# Patient Record
Sex: Male | Born: 1960
Health system: Southern US, Community
[De-identification: ages and names within clinical notes are randomized; demographics above are authoritative.]

## PROBLEM LIST (undated history)

## (undated) DIAGNOSIS — N183 Chronic kidney disease, stage 3 unspecified: Secondary | ICD-10-CM

## (undated) DIAGNOSIS — J449 Chronic obstructive pulmonary disease, unspecified: Secondary | ICD-10-CM

## (undated) DIAGNOSIS — J1569 Pneumonia due to other gram-negative bacteria: Secondary | ICD-10-CM

## (undated) DIAGNOSIS — J156 Pneumonia due to other aerobic Gram-negative bacteria: Secondary | ICD-10-CM

## (undated) DIAGNOSIS — J9621 Acute and chronic respiratory failure with hypoxia: Secondary | ICD-10-CM

---

## 2010-06-15 ENCOUNTER — Ambulatory Visit: Payer: Self-pay | Admitting: Cardiothoracic Surgery

## 2010-08-05 ENCOUNTER — Ambulatory Visit: Payer: Self-pay | Admitting: Thoracic Surgery (Cardiothoracic Vascular Surgery)

## 2010-10-19 NOTE — Assessment & Plan Note (Unsigned)
[facility] VISIT  Jackson Hahn, Jackson Hahn DOB:  [birth year]                                        August 09, 2010 CHART #:  [id number]  Dr. Jerelene Redden saw the patient in the clinic on August 05, 2010, and the patient's condition was discussed and they were in agreement that the patient will continue in followup with Dr. Atilano Median and have a CT scan in 6 months to evaluate his aneurysm.  Ishmael Holter. Jerelene Redden, MD  BC/MEDQ  D:  08/09/2010  T:  08/10/2010  Job:  [id number]

## 2011-09-19 DIAGNOSIS — Z7901 Long term (current) use of anticoagulants: Secondary | ICD-10-CM | POA: Diagnosis not present

## 2011-09-26 DIAGNOSIS — J449 Chronic obstructive pulmonary disease, unspecified: Secondary | ICD-10-CM | POA: Diagnosis not present

## 2011-10-20 DIAGNOSIS — Z7901 Long term (current) use of anticoagulants: Secondary | ICD-10-CM | POA: Diagnosis not present

## 2011-10-20 DIAGNOSIS — Z79899 Other long term (current) drug therapy: Secondary | ICD-10-CM | POA: Diagnosis not present

## 2011-10-30 DIAGNOSIS — J309 Allergic rhinitis, unspecified: Secondary | ICD-10-CM | POA: Diagnosis not present

## 2011-10-30 DIAGNOSIS — J449 Chronic obstructive pulmonary disease, unspecified: Secondary | ICD-10-CM | POA: Diagnosis not present

## 2011-10-30 DIAGNOSIS — Z954 Presence of other heart-valve replacement: Secondary | ICD-10-CM | POA: Diagnosis not present

## 2011-10-30 DIAGNOSIS — R0602 Shortness of breath: Secondary | ICD-10-CM | POA: Diagnosis not present

## 2011-11-03 DIAGNOSIS — J309 Allergic rhinitis, unspecified: Secondary | ICD-10-CM | POA: Diagnosis not present

## 2011-11-13 DIAGNOSIS — J9801 Acute bronchospasm: Secondary | ICD-10-CM | POA: Diagnosis not present

## 2011-11-13 DIAGNOSIS — J449 Chronic obstructive pulmonary disease, unspecified: Secondary | ICD-10-CM | POA: Diagnosis not present

## 2011-11-13 DIAGNOSIS — Z9109 Other allergy status, other than to drugs and biological substances: Secondary | ICD-10-CM | POA: Diagnosis not present

## 2011-11-22 DIAGNOSIS — E782 Mixed hyperlipidemia: Secondary | ICD-10-CM | POA: Diagnosis not present

## 2011-11-22 DIAGNOSIS — Z7901 Long term (current) use of anticoagulants: Secondary | ICD-10-CM | POA: Diagnosis not present

## 2011-12-14 DIAGNOSIS — J209 Acute bronchitis, unspecified: Secondary | ICD-10-CM | POA: Diagnosis not present

## 2011-12-20 DIAGNOSIS — I517 Cardiomegaly: Secondary | ICD-10-CM | POA: Diagnosis not present

## 2011-12-20 DIAGNOSIS — Z7901 Long term (current) use of anticoagulants: Secondary | ICD-10-CM | POA: Diagnosis not present

## 2011-12-20 DIAGNOSIS — E782 Mixed hyperlipidemia: Secondary | ICD-10-CM | POA: Diagnosis not present

## 2011-12-21 DIAGNOSIS — J449 Chronic obstructive pulmonary disease, unspecified: Secondary | ICD-10-CM | POA: Diagnosis not present

## 2011-12-21 DIAGNOSIS — J209 Acute bronchitis, unspecified: Secondary | ICD-10-CM | POA: Diagnosis not present

## 2011-12-21 DIAGNOSIS — I517 Cardiomegaly: Secondary | ICD-10-CM | POA: Diagnosis not present

## 2011-12-21 DIAGNOSIS — I719 Aortic aneurysm of unspecified site, without rupture: Secondary | ICD-10-CM | POA: Diagnosis not present

## 2011-12-26 DIAGNOSIS — I517 Cardiomegaly: Secondary | ICD-10-CM | POA: Diagnosis not present

## 2011-12-26 DIAGNOSIS — E782 Mixed hyperlipidemia: Secondary | ICD-10-CM | POA: Diagnosis not present

## 2011-12-26 DIAGNOSIS — J449 Chronic obstructive pulmonary disease, unspecified: Secondary | ICD-10-CM | POA: Diagnosis not present

## 2012-01-22 DIAGNOSIS — E559 Vitamin D deficiency, unspecified: Secondary | ICD-10-CM | POA: Diagnosis not present

## 2012-01-22 DIAGNOSIS — Z125 Encounter for screening for malignant neoplasm of prostate: Secondary | ICD-10-CM | POA: Diagnosis not present

## 2012-01-22 DIAGNOSIS — E782 Mixed hyperlipidemia: Secondary | ICD-10-CM | POA: Diagnosis not present

## 2012-01-22 DIAGNOSIS — I517 Cardiomegaly: Secondary | ICD-10-CM | POA: Diagnosis not present

## 2012-01-22 DIAGNOSIS — N529 Male erectile dysfunction, unspecified: Secondary | ICD-10-CM | POA: Diagnosis not present

## 2012-01-22 DIAGNOSIS — R0602 Shortness of breath: Secondary | ICD-10-CM | POA: Diagnosis not present

## 2012-01-22 DIAGNOSIS — Z7901 Long term (current) use of anticoagulants: Secondary | ICD-10-CM | POA: Diagnosis not present

## 2012-01-25 DIAGNOSIS — I719 Aortic aneurysm of unspecified site, without rupture: Secondary | ICD-10-CM | POA: Diagnosis not present

## 2012-02-01 DIAGNOSIS — J449 Chronic obstructive pulmonary disease, unspecified: Secondary | ICD-10-CM | POA: Diagnosis not present

## 2012-02-01 DIAGNOSIS — J9801 Acute bronchospasm: Secondary | ICD-10-CM | POA: Diagnosis not present

## 2012-02-06 DIAGNOSIS — N289 Disorder of kidney and ureter, unspecified: Secondary | ICD-10-CM | POA: Diagnosis not present

## 2012-02-06 DIAGNOSIS — D689 Coagulation defect, unspecified: Secondary | ICD-10-CM | POA: Diagnosis not present

## 2012-02-06 DIAGNOSIS — Z7901 Long term (current) use of anticoagulants: Secondary | ICD-10-CM | POA: Diagnosis not present

## 2012-02-06 DIAGNOSIS — E782 Mixed hyperlipidemia: Secondary | ICD-10-CM | POA: Diagnosis not present

## 2012-02-06 DIAGNOSIS — I719 Aortic aneurysm of unspecified site, without rupture: Secondary | ICD-10-CM | POA: Diagnosis not present

## 2012-02-07 DIAGNOSIS — N289 Disorder of kidney and ureter, unspecified: Secondary | ICD-10-CM | POA: Diagnosis not present

## 2012-03-05 DIAGNOSIS — E782 Mixed hyperlipidemia: Secondary | ICD-10-CM | POA: Diagnosis not present

## 2012-03-05 DIAGNOSIS — Z0000 Encounter for general adult medical examination without abnormal findings: Secondary | ICD-10-CM | POA: Diagnosis not present

## 2012-03-05 DIAGNOSIS — J069 Acute upper respiratory infection, unspecified: Secondary | ICD-10-CM | POA: Diagnosis not present

## 2012-03-05 DIAGNOSIS — Z7901 Long term (current) use of anticoagulants: Secondary | ICD-10-CM | POA: Diagnosis not present

## 2012-04-03 DIAGNOSIS — E782 Mixed hyperlipidemia: Secondary | ICD-10-CM | POA: Diagnosis not present

## 2012-04-03 DIAGNOSIS — J449 Chronic obstructive pulmonary disease, unspecified: Secondary | ICD-10-CM | POA: Diagnosis not present

## 2012-04-03 DIAGNOSIS — Z7901 Long term (current) use of anticoagulants: Secondary | ICD-10-CM | POA: Diagnosis not present

## 2012-04-03 DIAGNOSIS — D689 Coagulation defect, unspecified: Secondary | ICD-10-CM | POA: Diagnosis not present

## 2012-05-02 DIAGNOSIS — Z954 Presence of other heart-valve replacement: Secondary | ICD-10-CM | POA: Diagnosis not present

## 2012-05-02 DIAGNOSIS — I712 Thoracic aortic aneurysm, without rupture: Secondary | ICD-10-CM | POA: Diagnosis not present

## 2012-05-02 DIAGNOSIS — J449 Chronic obstructive pulmonary disease, unspecified: Secondary | ICD-10-CM | POA: Diagnosis not present

## 2012-05-02 DIAGNOSIS — Z79899 Other long term (current) drug therapy: Secondary | ICD-10-CM | POA: Diagnosis not present

## 2012-05-03 DIAGNOSIS — D689 Coagulation defect, unspecified: Secondary | ICD-10-CM | POA: Diagnosis not present

## 2012-05-03 DIAGNOSIS — Z7901 Long term (current) use of anticoagulants: Secondary | ICD-10-CM | POA: Diagnosis not present

## 2012-05-03 DIAGNOSIS — Z79899 Other long term (current) drug therapy: Secondary | ICD-10-CM | POA: Diagnosis not present

## 2012-05-06 DIAGNOSIS — K7689 Other specified diseases of liver: Secondary | ICD-10-CM | POA: Diagnosis not present

## 2012-05-06 DIAGNOSIS — I712 Thoracic aortic aneurysm, without rupture: Secondary | ICD-10-CM | POA: Diagnosis not present

## 2012-05-07 DIAGNOSIS — J449 Chronic obstructive pulmonary disease, unspecified: Secondary | ICD-10-CM | POA: Diagnosis not present

## 2012-05-07 DIAGNOSIS — I712 Thoracic aortic aneurysm, without rupture: Secondary | ICD-10-CM | POA: Diagnosis not present

## 2012-06-03 DIAGNOSIS — Z7901 Long term (current) use of anticoagulants: Secondary | ICD-10-CM | POA: Diagnosis not present

## 2012-06-03 DIAGNOSIS — E782 Mixed hyperlipidemia: Secondary | ICD-10-CM | POA: Diagnosis not present

## 2012-06-03 DIAGNOSIS — D689 Coagulation defect, unspecified: Secondary | ICD-10-CM | POA: Diagnosis not present

## 2012-07-04 DIAGNOSIS — E782 Mixed hyperlipidemia: Secondary | ICD-10-CM | POA: Diagnosis not present

## 2012-07-04 DIAGNOSIS — D689 Coagulation defect, unspecified: Secondary | ICD-10-CM | POA: Diagnosis not present

## 2012-07-04 DIAGNOSIS — Z7901 Long term (current) use of anticoagulants: Secondary | ICD-10-CM | POA: Diagnosis not present

## 2012-08-02 DIAGNOSIS — Z7901 Long term (current) use of anticoagulants: Secondary | ICD-10-CM | POA: Diagnosis not present

## 2012-08-02 DIAGNOSIS — E782 Mixed hyperlipidemia: Secondary | ICD-10-CM | POA: Diagnosis not present

## 2012-08-02 DIAGNOSIS — E559 Vitamin D deficiency, unspecified: Secondary | ICD-10-CM | POA: Diagnosis not present

## 2012-08-02 DIAGNOSIS — R0602 Shortness of breath: Secondary | ICD-10-CM | POA: Diagnosis not present

## 2012-08-02 DIAGNOSIS — D689 Coagulation defect, unspecified: Secondary | ICD-10-CM | POA: Diagnosis not present

## 2012-08-23 DIAGNOSIS — J449 Chronic obstructive pulmonary disease, unspecified: Secondary | ICD-10-CM | POA: Diagnosis not present

## 2012-08-23 DIAGNOSIS — J9801 Acute bronchospasm: Secondary | ICD-10-CM | POA: Diagnosis not present

## 2012-08-23 DIAGNOSIS — Z79899 Other long term (current) drug therapy: Secondary | ICD-10-CM | POA: Diagnosis not present

## 2012-09-05 DIAGNOSIS — E559 Vitamin D deficiency, unspecified: Secondary | ICD-10-CM | POA: Diagnosis not present

## 2012-09-05 DIAGNOSIS — E782 Mixed hyperlipidemia: Secondary | ICD-10-CM | POA: Diagnosis not present

## 2012-09-05 DIAGNOSIS — D689 Coagulation defect, unspecified: Secondary | ICD-10-CM | POA: Diagnosis not present

## 2012-09-24 DIAGNOSIS — D689 Coagulation defect, unspecified: Secondary | ICD-10-CM | POA: Diagnosis not present

## 2012-09-24 DIAGNOSIS — J069 Acute upper respiratory infection, unspecified: Secondary | ICD-10-CM | POA: Diagnosis not present

## 2012-10-10 DIAGNOSIS — Z7901 Long term (current) use of anticoagulants: Secondary | ICD-10-CM | POA: Diagnosis not present

## 2012-10-10 DIAGNOSIS — D689 Coagulation defect, unspecified: Secondary | ICD-10-CM | POA: Diagnosis not present

## 2012-10-25 DIAGNOSIS — D689 Coagulation defect, unspecified: Secondary | ICD-10-CM | POA: Diagnosis not present

## 2012-10-25 DIAGNOSIS — Z7901 Long term (current) use of anticoagulants: Secondary | ICD-10-CM | POA: Diagnosis not present

## 2012-11-22 DIAGNOSIS — E782 Mixed hyperlipidemia: Secondary | ICD-10-CM | POA: Diagnosis not present

## 2012-11-22 DIAGNOSIS — D689 Coagulation defect, unspecified: Secondary | ICD-10-CM | POA: Diagnosis not present

## 2012-11-22 DIAGNOSIS — Z7901 Long term (current) use of anticoagulants: Secondary | ICD-10-CM | POA: Diagnosis not present

## 2012-11-22 DIAGNOSIS — J069 Acute upper respiratory infection, unspecified: Secondary | ICD-10-CM | POA: Diagnosis not present

## 2012-12-17 DIAGNOSIS — I719 Aortic aneurysm of unspecified site, without rupture: Secondary | ICD-10-CM | POA: Diagnosis not present

## 2012-12-17 DIAGNOSIS — Q605 Renal hypoplasia, unspecified: Secondary | ICD-10-CM | POA: Diagnosis not present

## 2012-12-17 DIAGNOSIS — Q602 Renal agenesis, unspecified: Secondary | ICD-10-CM | POA: Diagnosis not present

## 2012-12-17 DIAGNOSIS — Z954 Presence of other heart-valve replacement: Secondary | ICD-10-CM | POA: Diagnosis not present

## 2012-12-17 DIAGNOSIS — J449 Chronic obstructive pulmonary disease, unspecified: Secondary | ICD-10-CM | POA: Diagnosis not present

## 2012-12-25 DIAGNOSIS — E782 Mixed hyperlipidemia: Secondary | ICD-10-CM | POA: Diagnosis not present

## 2012-12-25 DIAGNOSIS — Z7901 Long term (current) use of anticoagulants: Secondary | ICD-10-CM | POA: Diagnosis not present

## 2012-12-25 DIAGNOSIS — D689 Coagulation defect, unspecified: Secondary | ICD-10-CM | POA: Diagnosis not present

## 2013-01-15 DIAGNOSIS — E782 Mixed hyperlipidemia: Secondary | ICD-10-CM | POA: Diagnosis not present

## 2013-01-15 DIAGNOSIS — Z7901 Long term (current) use of anticoagulants: Secondary | ICD-10-CM | POA: Diagnosis not present

## 2013-01-15 DIAGNOSIS — D689 Coagulation defect, unspecified: Secondary | ICD-10-CM | POA: Diagnosis not present

## 2013-02-14 DIAGNOSIS — J4489 Other specified chronic obstructive pulmonary disease: Secondary | ICD-10-CM | POA: Diagnosis not present

## 2013-02-14 DIAGNOSIS — Z7901 Long term (current) use of anticoagulants: Secondary | ICD-10-CM | POA: Diagnosis not present

## 2013-02-14 DIAGNOSIS — J449 Chronic obstructive pulmonary disease, unspecified: Secondary | ICD-10-CM | POA: Diagnosis not present

## 2013-02-14 DIAGNOSIS — Z954 Presence of other heart-valve replacement: Secondary | ICD-10-CM | POA: Diagnosis not present

## 2013-03-17 DIAGNOSIS — Z6836 Body mass index (BMI) 36.0-36.9, adult: Secondary | ICD-10-CM | POA: Diagnosis not present

## 2013-03-17 DIAGNOSIS — J449 Chronic obstructive pulmonary disease, unspecified: Secondary | ICD-10-CM | POA: Diagnosis not present

## 2013-03-17 DIAGNOSIS — J4489 Other specified chronic obstructive pulmonary disease: Secondary | ICD-10-CM | POA: Diagnosis not present

## 2013-03-17 DIAGNOSIS — E782 Mixed hyperlipidemia: Secondary | ICD-10-CM | POA: Diagnosis not present

## 2013-03-17 DIAGNOSIS — Z7901 Long term (current) use of anticoagulants: Secondary | ICD-10-CM | POA: Diagnosis not present

## 2013-03-20 DIAGNOSIS — M949 Disorder of cartilage, unspecified: Secondary | ICD-10-CM | POA: Diagnosis not present

## 2013-03-20 DIAGNOSIS — M899 Disorder of bone, unspecified: Secondary | ICD-10-CM | POA: Diagnosis not present

## 2013-04-17 DIAGNOSIS — Z7901 Long term (current) use of anticoagulants: Secondary | ICD-10-CM | POA: Diagnosis not present

## 2013-04-17 DIAGNOSIS — Z79899 Other long term (current) drug therapy: Secondary | ICD-10-CM | POA: Diagnosis not present

## 2013-04-17 DIAGNOSIS — E782 Mixed hyperlipidemia: Secondary | ICD-10-CM | POA: Diagnosis not present

## 2013-04-17 DIAGNOSIS — J449 Chronic obstructive pulmonary disease, unspecified: Secondary | ICD-10-CM | POA: Diagnosis not present

## 2013-04-17 DIAGNOSIS — Z6836 Body mass index (BMI) 36.0-36.9, adult: Secondary | ICD-10-CM | POA: Diagnosis not present

## 2013-04-18 DIAGNOSIS — Z79899 Other long term (current) drug therapy: Secondary | ICD-10-CM | POA: Diagnosis not present

## 2013-04-18 DIAGNOSIS — Z954 Presence of other heart-valve replacement: Secondary | ICD-10-CM | POA: Diagnosis not present

## 2013-04-18 DIAGNOSIS — J449 Chronic obstructive pulmonary disease, unspecified: Secondary | ICD-10-CM | POA: Diagnosis not present

## 2013-04-18 DIAGNOSIS — F17200 Nicotine dependence, unspecified, uncomplicated: Secondary | ICD-10-CM | POA: Diagnosis not present

## 2013-05-07 DIAGNOSIS — J3489 Other specified disorders of nose and nasal sinuses: Secondary | ICD-10-CM | POA: Diagnosis not present

## 2013-05-07 DIAGNOSIS — R369 Urethral discharge, unspecified: Secondary | ICD-10-CM | POA: Diagnosis not present

## 2013-05-07 DIAGNOSIS — Z7901 Long term (current) use of anticoagulants: Secondary | ICD-10-CM | POA: Diagnosis not present

## 2013-05-07 DIAGNOSIS — S81009A Unspecified open wound, unspecified knee, initial encounter: Secondary | ICD-10-CM | POA: Diagnosis not present

## 2013-05-07 DIAGNOSIS — E782 Mixed hyperlipidemia: Secondary | ICD-10-CM | POA: Diagnosis not present

## 2013-05-07 DIAGNOSIS — Z79899 Other long term (current) drug therapy: Secondary | ICD-10-CM | POA: Diagnosis not present

## 2013-05-09 DIAGNOSIS — I712 Thoracic aortic aneurysm, without rupture: Secondary | ICD-10-CM | POA: Diagnosis not present

## 2013-05-09 DIAGNOSIS — Z954 Presence of other heart-valve replacement: Secondary | ICD-10-CM | POA: Diagnosis not present

## 2013-05-15 DIAGNOSIS — I712 Thoracic aortic aneurysm, without rupture: Secondary | ICD-10-CM | POA: Diagnosis not present

## 2013-05-27 DIAGNOSIS — E782 Mixed hyperlipidemia: Secondary | ICD-10-CM | POA: Diagnosis not present

## 2013-05-27 DIAGNOSIS — R369 Urethral discharge, unspecified: Secondary | ICD-10-CM | POA: Diagnosis not present

## 2013-05-27 DIAGNOSIS — Z7901 Long term (current) use of anticoagulants: Secondary | ICD-10-CM | POA: Diagnosis not present

## 2013-06-16 DIAGNOSIS — J42 Unspecified chronic bronchitis: Secondary | ICD-10-CM | POA: Diagnosis not present

## 2013-06-18 DIAGNOSIS — J449 Chronic obstructive pulmonary disease, unspecified: Secondary | ICD-10-CM | POA: Diagnosis not present

## 2013-06-18 DIAGNOSIS — J9801 Acute bronchospasm: Secondary | ICD-10-CM | POA: Diagnosis not present

## 2013-06-18 DIAGNOSIS — Z954 Presence of other heart-valve replacement: Secondary | ICD-10-CM | POA: Diagnosis not present

## 2013-06-18 DIAGNOSIS — R0902 Hypoxemia: Secondary | ICD-10-CM | POA: Diagnosis not present

## 2013-06-18 DIAGNOSIS — F17200 Nicotine dependence, unspecified, uncomplicated: Secondary | ICD-10-CM | POA: Diagnosis not present

## 2013-06-25 DIAGNOSIS — J45909 Unspecified asthma, uncomplicated: Secondary | ICD-10-CM | POA: Diagnosis not present

## 2013-06-25 DIAGNOSIS — R0902 Hypoxemia: Secondary | ICD-10-CM | POA: Diagnosis not present

## 2013-06-25 DIAGNOSIS — J209 Acute bronchitis, unspecified: Secondary | ICD-10-CM | POA: Diagnosis not present

## 2013-06-26 DIAGNOSIS — Z7901 Long term (current) use of anticoagulants: Secondary | ICD-10-CM | POA: Diagnosis not present

## 2013-06-26 DIAGNOSIS — J449 Chronic obstructive pulmonary disease, unspecified: Secondary | ICD-10-CM | POA: Diagnosis not present

## 2013-06-26 DIAGNOSIS — J4489 Other specified chronic obstructive pulmonary disease: Secondary | ICD-10-CM | POA: Diagnosis not present

## 2013-06-26 DIAGNOSIS — E782 Mixed hyperlipidemia: Secondary | ICD-10-CM | POA: Diagnosis not present

## 2013-07-28 DIAGNOSIS — E782 Mixed hyperlipidemia: Secondary | ICD-10-CM | POA: Diagnosis not present

## 2013-07-28 DIAGNOSIS — J449 Chronic obstructive pulmonary disease, unspecified: Secondary | ICD-10-CM | POA: Diagnosis not present

## 2013-07-28 DIAGNOSIS — Z7901 Long term (current) use of anticoagulants: Secondary | ICD-10-CM | POA: Diagnosis not present

## 2013-07-28 DIAGNOSIS — Z6836 Body mass index (BMI) 36.0-36.9, adult: Secondary | ICD-10-CM | POA: Diagnosis not present

## 2013-07-28 DIAGNOSIS — Z79899 Other long term (current) drug therapy: Secondary | ICD-10-CM | POA: Diagnosis not present

## 2013-08-04 DIAGNOSIS — R300 Dysuria: Secondary | ICD-10-CM | POA: Diagnosis not present

## 2013-08-04 DIAGNOSIS — Z7901 Long term (current) use of anticoagulants: Secondary | ICD-10-CM | POA: Diagnosis not present

## 2013-08-04 DIAGNOSIS — R369 Urethral discharge, unspecified: Secondary | ICD-10-CM | POA: Diagnosis not present

## 2013-08-15 DIAGNOSIS — Q602 Renal agenesis, unspecified: Secondary | ICD-10-CM | POA: Diagnosis not present

## 2013-08-15 DIAGNOSIS — J449 Chronic obstructive pulmonary disease, unspecified: Secondary | ICD-10-CM | POA: Diagnosis not present

## 2013-08-15 DIAGNOSIS — R0602 Shortness of breath: Secondary | ICD-10-CM | POA: Diagnosis not present

## 2013-08-15 DIAGNOSIS — I719 Aortic aneurysm of unspecified site, without rupture: Secondary | ICD-10-CM | POA: Diagnosis not present

## 2013-08-29 DIAGNOSIS — J449 Chronic obstructive pulmonary disease, unspecified: Secondary | ICD-10-CM | POA: Diagnosis not present

## 2013-08-29 DIAGNOSIS — E782 Mixed hyperlipidemia: Secondary | ICD-10-CM | POA: Diagnosis not present

## 2013-08-29 DIAGNOSIS — Z7901 Long term (current) use of anticoagulants: Secondary | ICD-10-CM | POA: Diagnosis not present

## 2013-09-29 DIAGNOSIS — Z6837 Body mass index (BMI) 37.0-37.9, adult: Secondary | ICD-10-CM | POA: Diagnosis not present

## 2013-09-29 DIAGNOSIS — J309 Allergic rhinitis, unspecified: Secondary | ICD-10-CM | POA: Diagnosis not present

## 2013-09-29 DIAGNOSIS — J4489 Other specified chronic obstructive pulmonary disease: Secondary | ICD-10-CM | POA: Diagnosis not present

## 2013-09-29 DIAGNOSIS — J449 Chronic obstructive pulmonary disease, unspecified: Secondary | ICD-10-CM | POA: Diagnosis not present

## 2013-09-29 DIAGNOSIS — Z7901 Long term (current) use of anticoagulants: Secondary | ICD-10-CM | POA: Diagnosis not present

## 2013-10-02 DIAGNOSIS — K5730 Diverticulosis of large intestine without perforation or abscess without bleeding: Secondary | ICD-10-CM | POA: Diagnosis not present

## 2013-10-02 DIAGNOSIS — Z954 Presence of other heart-valve replacement: Secondary | ICD-10-CM | POA: Diagnosis not present

## 2013-10-02 DIAGNOSIS — Z79899 Other long term (current) drug therapy: Secondary | ICD-10-CM | POA: Diagnosis not present

## 2013-10-02 DIAGNOSIS — K648 Other hemorrhoids: Secondary | ICD-10-CM | POA: Diagnosis not present

## 2013-10-02 DIAGNOSIS — K5900 Constipation, unspecified: Secondary | ICD-10-CM | POA: Diagnosis not present

## 2013-10-02 DIAGNOSIS — Z1211 Encounter for screening for malignant neoplasm of colon: Secondary | ICD-10-CM | POA: Diagnosis not present

## 2013-10-02 DIAGNOSIS — Z9889 Other specified postprocedural states: Secondary | ICD-10-CM | POA: Diagnosis not present

## 2013-10-02 DIAGNOSIS — D128 Benign neoplasm of rectum: Secondary | ICD-10-CM | POA: Diagnosis not present

## 2013-10-02 DIAGNOSIS — K620 Anal polyp: Secondary | ICD-10-CM | POA: Diagnosis not present

## 2013-10-02 DIAGNOSIS — Z96649 Presence of unspecified artificial hip joint: Secondary | ICD-10-CM | POA: Diagnosis not present

## 2013-10-02 DIAGNOSIS — K621 Rectal polyp: Secondary | ICD-10-CM | POA: Diagnosis not present

## 2013-10-02 DIAGNOSIS — E7800 Pure hypercholesterolemia, unspecified: Secondary | ICD-10-CM | POA: Diagnosis not present

## 2013-10-02 DIAGNOSIS — J449 Chronic obstructive pulmonary disease, unspecified: Secondary | ICD-10-CM | POA: Diagnosis not present

## 2013-10-02 DIAGNOSIS — D129 Benign neoplasm of anus and anal canal: Secondary | ICD-10-CM | POA: Diagnosis not present

## 2013-10-06 DIAGNOSIS — Z7901 Long term (current) use of anticoagulants: Secondary | ICD-10-CM | POA: Diagnosis not present

## 2013-10-15 DIAGNOSIS — J449 Chronic obstructive pulmonary disease, unspecified: Secondary | ICD-10-CM | POA: Diagnosis not present

## 2013-10-15 DIAGNOSIS — I517 Cardiomegaly: Secondary | ICD-10-CM | POA: Diagnosis not present

## 2013-10-15 DIAGNOSIS — R0602 Shortness of breath: Secondary | ICD-10-CM | POA: Diagnosis not present

## 2013-10-15 DIAGNOSIS — J9801 Acute bronchospasm: Secondary | ICD-10-CM | POA: Diagnosis not present

## 2013-10-24 DIAGNOSIS — F17200 Nicotine dependence, unspecified, uncomplicated: Secondary | ICD-10-CM | POA: Diagnosis not present

## 2013-10-24 DIAGNOSIS — J42 Unspecified chronic bronchitis: Secondary | ICD-10-CM | POA: Diagnosis not present

## 2013-10-26 DIAGNOSIS — J189 Pneumonia, unspecified organism: Secondary | ICD-10-CM | POA: Diagnosis not present

## 2013-10-26 DIAGNOSIS — N189 Chronic kidney disease, unspecified: Secondary | ICD-10-CM | POA: Diagnosis not present

## 2013-10-26 DIAGNOSIS — I428 Other cardiomyopathies: Secondary | ICD-10-CM | POA: Diagnosis not present

## 2013-10-26 DIAGNOSIS — R0902 Hypoxemia: Secondary | ICD-10-CM | POA: Diagnosis not present

## 2013-10-26 DIAGNOSIS — I959 Hypotension, unspecified: Secondary | ICD-10-CM | POA: Diagnosis present

## 2013-10-26 DIAGNOSIS — I517 Cardiomegaly: Secondary | ICD-10-CM | POA: Diagnosis not present

## 2013-10-26 DIAGNOSIS — I4901 Ventricular fibrillation: Secondary | ICD-10-CM | POA: Diagnosis not present

## 2013-10-26 DIAGNOSIS — N179 Acute kidney failure, unspecified: Secondary | ICD-10-CM | POA: Diagnosis present

## 2013-10-26 DIAGNOSIS — J4489 Other specified chronic obstructive pulmonary disease: Secondary | ICD-10-CM | POA: Diagnosis not present

## 2013-10-26 DIAGNOSIS — J449 Chronic obstructive pulmonary disease, unspecified: Secondary | ICD-10-CM | POA: Diagnosis not present

## 2013-10-26 DIAGNOSIS — R5381 Other malaise: Secondary | ICD-10-CM | POA: Diagnosis present

## 2013-10-26 DIAGNOSIS — R141 Gas pain: Secondary | ICD-10-CM | POA: Diagnosis not present

## 2013-10-26 DIAGNOSIS — I359 Nonrheumatic aortic valve disorder, unspecified: Secondary | ICD-10-CM | POA: Diagnosis not present

## 2013-10-26 DIAGNOSIS — Z8674 Personal history of sudden cardiac arrest: Secondary | ICD-10-CM | POA: Diagnosis not present

## 2013-10-26 DIAGNOSIS — R143 Flatulence: Secondary | ICD-10-CM | POA: Diagnosis not present

## 2013-10-26 DIAGNOSIS — E875 Hyperkalemia: Secondary | ICD-10-CM | POA: Diagnosis not present

## 2013-10-26 DIAGNOSIS — K6389 Other specified diseases of intestine: Secondary | ICD-10-CM | POA: Diagnosis not present

## 2013-10-26 DIAGNOSIS — J45901 Unspecified asthma with (acute) exacerbation: Secondary | ICD-10-CM | POA: Diagnosis present

## 2013-10-26 DIAGNOSIS — J9801 Acute bronchospasm: Secondary | ICD-10-CM | POA: Diagnosis not present

## 2013-10-26 DIAGNOSIS — Z9911 Dependence on respirator [ventilator] status: Secondary | ICD-10-CM | POA: Diagnosis not present

## 2013-10-26 DIAGNOSIS — I469 Cardiac arrest, cause unspecified: Secondary | ICD-10-CM | POA: Diagnosis not present

## 2013-10-26 DIAGNOSIS — R0602 Shortness of breath: Secondary | ICD-10-CM | POA: Diagnosis not present

## 2013-10-26 DIAGNOSIS — Z954 Presence of other heart-valve replacement: Secondary | ICD-10-CM | POA: Diagnosis not present

## 2013-10-26 DIAGNOSIS — J159 Unspecified bacterial pneumonia: Secondary | ICD-10-CM | POA: Diagnosis not present

## 2013-10-26 DIAGNOSIS — Z4682 Encounter for fitting and adjustment of non-vascular catheter: Secondary | ICD-10-CM | POA: Diagnosis not present

## 2013-10-26 DIAGNOSIS — I509 Heart failure, unspecified: Secondary | ICD-10-CM | POA: Diagnosis not present

## 2013-10-26 DIAGNOSIS — R918 Other nonspecific abnormal finding of lung field: Secondary | ICD-10-CM | POA: Diagnosis not present

## 2013-10-26 DIAGNOSIS — I712 Thoracic aortic aneurysm, without rupture, unspecified: Secondary | ICD-10-CM | POA: Diagnosis present

## 2013-10-26 DIAGNOSIS — I498 Other specified cardiac arrhythmias: Secondary | ICD-10-CM | POA: Diagnosis present

## 2013-10-26 DIAGNOSIS — J9600 Acute respiratory failure, unspecified whether with hypoxia or hypercapnia: Secondary | ICD-10-CM | POA: Diagnosis not present

## 2013-10-26 DIAGNOSIS — E119 Type 2 diabetes mellitus without complications: Secondary | ICD-10-CM | POA: Diagnosis present

## 2013-10-26 DIAGNOSIS — Z7901 Long term (current) use of anticoagulants: Secondary | ICD-10-CM | POA: Diagnosis not present

## 2013-10-26 DIAGNOSIS — A419 Sepsis, unspecified organism: Secondary | ICD-10-CM | POA: Diagnosis not present

## 2013-10-26 DIAGNOSIS — J441 Chronic obstructive pulmonary disease with (acute) exacerbation: Secondary | ICD-10-CM | POA: Diagnosis present

## 2013-10-26 DIAGNOSIS — R0609 Other forms of dyspnea: Secondary | ICD-10-CM | POA: Diagnosis not present

## 2013-10-26 DIAGNOSIS — R748 Abnormal levels of other serum enzymes: Secondary | ICD-10-CM | POA: Diagnosis not present

## 2013-10-26 DIAGNOSIS — J9620 Acute and chronic respiratory failure, unspecified whether with hypoxia or hypercapnia: Secondary | ICD-10-CM | POA: Diagnosis not present

## 2013-10-26 DIAGNOSIS — Z452 Encounter for adjustment and management of vascular access device: Secondary | ICD-10-CM | POA: Diagnosis not present

## 2013-11-04 DIAGNOSIS — J4489 Other specified chronic obstructive pulmonary disease: Secondary | ICD-10-CM | POA: Diagnosis not present

## 2013-11-04 DIAGNOSIS — Z7901 Long term (current) use of anticoagulants: Secondary | ICD-10-CM | POA: Diagnosis not present

## 2013-11-04 DIAGNOSIS — J449 Chronic obstructive pulmonary disease, unspecified: Secondary | ICD-10-CM | POA: Diagnosis not present

## 2013-11-04 DIAGNOSIS — E782 Mixed hyperlipidemia: Secondary | ICD-10-CM | POA: Diagnosis not present

## 2013-11-10 DIAGNOSIS — B370 Candidal stomatitis: Secondary | ICD-10-CM | POA: Diagnosis not present

## 2013-11-10 DIAGNOSIS — I517 Cardiomegaly: Secondary | ICD-10-CM | POA: Diagnosis not present

## 2013-11-10 DIAGNOSIS — J449 Chronic obstructive pulmonary disease, unspecified: Secondary | ICD-10-CM | POA: Diagnosis not present

## 2013-11-10 DIAGNOSIS — J45909 Unspecified asthma, uncomplicated: Secondary | ICD-10-CM | POA: Diagnosis not present

## 2013-11-10 DIAGNOSIS — J4489 Other specified chronic obstructive pulmonary disease: Secondary | ICD-10-CM | POA: Diagnosis not present

## 2013-11-17 DIAGNOSIS — Z79899 Other long term (current) drug therapy: Secondary | ICD-10-CM | POA: Diagnosis not present

## 2013-11-17 DIAGNOSIS — I469 Cardiac arrest, cause unspecified: Secondary | ICD-10-CM | POA: Diagnosis not present

## 2013-11-17 DIAGNOSIS — J9801 Acute bronchospasm: Secondary | ICD-10-CM | POA: Diagnosis not present

## 2013-11-17 DIAGNOSIS — J449 Chronic obstructive pulmonary disease, unspecified: Secondary | ICD-10-CM | POA: Diagnosis not present

## 2013-11-17 DIAGNOSIS — Z7901 Long term (current) use of anticoagulants: Secondary | ICD-10-CM | POA: Diagnosis not present

## 2013-11-18 DIAGNOSIS — N179 Acute kidney failure, unspecified: Secondary | ICD-10-CM | POA: Diagnosis not present

## 2013-11-18 DIAGNOSIS — I509 Heart failure, unspecified: Secondary | ICD-10-CM | POA: Diagnosis not present

## 2013-11-18 DIAGNOSIS — J449 Chronic obstructive pulmonary disease, unspecified: Secondary | ICD-10-CM | POA: Diagnosis not present

## 2013-11-21 DIAGNOSIS — J449 Chronic obstructive pulmonary disease, unspecified: Secondary | ICD-10-CM | POA: Diagnosis not present

## 2013-11-21 DIAGNOSIS — Z79899 Other long term (current) drug therapy: Secondary | ICD-10-CM | POA: Diagnosis not present

## 2013-11-21 DIAGNOSIS — Z7901 Long term (current) use of anticoagulants: Secondary | ICD-10-CM | POA: Diagnosis not present

## 2013-11-21 DIAGNOSIS — J45909 Unspecified asthma, uncomplicated: Secondary | ICD-10-CM | POA: Diagnosis not present

## 2013-11-21 DIAGNOSIS — J4489 Other specified chronic obstructive pulmonary disease: Secondary | ICD-10-CM | POA: Diagnosis not present

## 2013-11-26 DIAGNOSIS — Z954 Presence of other heart-valve replacement: Secondary | ICD-10-CM | POA: Diagnosis not present

## 2013-11-26 DIAGNOSIS — I428 Other cardiomyopathies: Secondary | ICD-10-CM | POA: Diagnosis not present

## 2013-11-26 DIAGNOSIS — Z6834 Body mass index (BMI) 34.0-34.9, adult: Secondary | ICD-10-CM | POA: Diagnosis not present

## 2013-11-26 DIAGNOSIS — I712 Thoracic aortic aneurysm, without rupture, unspecified: Secondary | ICD-10-CM | POA: Diagnosis not present

## 2013-11-26 DIAGNOSIS — R791 Abnormal coagulation profile: Secondary | ICD-10-CM | POA: Diagnosis not present

## 2013-11-26 DIAGNOSIS — J4489 Other specified chronic obstructive pulmonary disease: Secondary | ICD-10-CM | POA: Diagnosis not present

## 2013-11-26 DIAGNOSIS — I509 Heart failure, unspecified: Secondary | ICD-10-CM | POA: Diagnosis not present

## 2013-11-26 DIAGNOSIS — J449 Chronic obstructive pulmonary disease, unspecified: Secondary | ICD-10-CM | POA: Diagnosis not present

## 2013-12-09 DIAGNOSIS — I509 Heart failure, unspecified: Secondary | ICD-10-CM | POA: Diagnosis not present

## 2013-12-09 DIAGNOSIS — Z6835 Body mass index (BMI) 35.0-35.9, adult: Secondary | ICD-10-CM | POA: Diagnosis not present

## 2013-12-09 DIAGNOSIS — E669 Obesity, unspecified: Secondary | ICD-10-CM | POA: Diagnosis not present

## 2013-12-09 DIAGNOSIS — J449 Chronic obstructive pulmonary disease, unspecified: Secondary | ICD-10-CM | POA: Diagnosis not present

## 2013-12-09 DIAGNOSIS — R791 Abnormal coagulation profile: Secondary | ICD-10-CM | POA: Diagnosis not present

## 2013-12-23 DIAGNOSIS — Z79899 Other long term (current) drug therapy: Secondary | ICD-10-CM | POA: Diagnosis not present

## 2013-12-23 DIAGNOSIS — Z954 Presence of other heart-valve replacement: Secondary | ICD-10-CM | POA: Diagnosis not present

## 2013-12-23 DIAGNOSIS — J45909 Unspecified asthma, uncomplicated: Secondary | ICD-10-CM | POA: Diagnosis not present

## 2013-12-23 DIAGNOSIS — J449 Chronic obstructive pulmonary disease, unspecified: Secondary | ICD-10-CM | POA: Diagnosis not present

## 2014-01-08 DIAGNOSIS — Z7901 Long term (current) use of anticoagulants: Secondary | ICD-10-CM | POA: Diagnosis not present

## 2014-01-08 DIAGNOSIS — E782 Mixed hyperlipidemia: Secondary | ICD-10-CM | POA: Diagnosis not present

## 2014-01-08 DIAGNOSIS — Z79899 Other long term (current) drug therapy: Secondary | ICD-10-CM | POA: Diagnosis not present

## 2014-01-08 DIAGNOSIS — Z6835 Body mass index (BMI) 35.0-35.9, adult: Secondary | ICD-10-CM | POA: Diagnosis not present

## 2014-01-20 DIAGNOSIS — N179 Acute kidney failure, unspecified: Secondary | ICD-10-CM | POA: Diagnosis not present

## 2014-01-27 DIAGNOSIS — N179 Acute kidney failure, unspecified: Secondary | ICD-10-CM | POA: Diagnosis not present

## 2014-01-27 DIAGNOSIS — N183 Chronic kidney disease, stage 3 unspecified: Secondary | ICD-10-CM | POA: Diagnosis not present

## 2014-01-27 DIAGNOSIS — F17200 Nicotine dependence, unspecified, uncomplicated: Secondary | ICD-10-CM | POA: Diagnosis not present

## 2014-01-27 DIAGNOSIS — J449 Chronic obstructive pulmonary disease, unspecified: Secondary | ICD-10-CM | POA: Diagnosis not present

## 2014-01-27 DIAGNOSIS — I509 Heart failure, unspecified: Secondary | ICD-10-CM | POA: Diagnosis not present

## 2014-02-11 DIAGNOSIS — I509 Heart failure, unspecified: Secondary | ICD-10-CM | POA: Diagnosis not present

## 2014-02-11 DIAGNOSIS — E782 Mixed hyperlipidemia: Secondary | ICD-10-CM | POA: Diagnosis not present

## 2014-02-11 DIAGNOSIS — R791 Abnormal coagulation profile: Secondary | ICD-10-CM | POA: Diagnosis not present

## 2014-02-11 DIAGNOSIS — N189 Chronic kidney disease, unspecified: Secondary | ICD-10-CM | POA: Diagnosis not present

## 2014-03-13 DIAGNOSIS — J449 Chronic obstructive pulmonary disease, unspecified: Secondary | ICD-10-CM | POA: Diagnosis not present

## 2014-03-13 DIAGNOSIS — Z6835 Body mass index (BMI) 35.0-35.9, adult: Secondary | ICD-10-CM | POA: Diagnosis not present

## 2014-03-13 DIAGNOSIS — I509 Heart failure, unspecified: Secondary | ICD-10-CM | POA: Diagnosis not present

## 2014-03-13 DIAGNOSIS — Z7901 Long term (current) use of anticoagulants: Secondary | ICD-10-CM | POA: Diagnosis not present

## 2014-03-24 DIAGNOSIS — J9801 Acute bronchospasm: Secondary | ICD-10-CM | POA: Diagnosis not present

## 2014-03-24 DIAGNOSIS — J449 Chronic obstructive pulmonary disease, unspecified: Secondary | ICD-10-CM | POA: Diagnosis not present

## 2014-03-24 DIAGNOSIS — Z954 Presence of other heart-valve replacement: Secondary | ICD-10-CM | POA: Diagnosis not present

## 2014-04-17 DIAGNOSIS — E782 Mixed hyperlipidemia: Secondary | ICD-10-CM | POA: Diagnosis not present

## 2014-04-17 DIAGNOSIS — Z7901 Long term (current) use of anticoagulants: Secondary | ICD-10-CM | POA: Diagnosis not present

## 2014-04-17 DIAGNOSIS — Z79899 Other long term (current) drug therapy: Secondary | ICD-10-CM | POA: Diagnosis not present

## 2014-04-17 DIAGNOSIS — J449 Chronic obstructive pulmonary disease, unspecified: Secondary | ICD-10-CM | POA: Diagnosis not present

## 2014-05-01 DIAGNOSIS — N183 Chronic kidney disease, stage 3 unspecified: Secondary | ICD-10-CM | POA: Diagnosis not present

## 2014-05-04 DIAGNOSIS — F17200 Nicotine dependence, unspecified, uncomplicated: Secondary | ICD-10-CM | POA: Diagnosis not present

## 2014-05-04 DIAGNOSIS — N183 Chronic kidney disease, stage 3 unspecified: Secondary | ICD-10-CM | POA: Diagnosis not present

## 2014-05-04 DIAGNOSIS — I509 Heart failure, unspecified: Secondary | ICD-10-CM | POA: Diagnosis not present

## 2014-05-19 DIAGNOSIS — N189 Chronic kidney disease, unspecified: Secondary | ICD-10-CM | POA: Diagnosis not present

## 2014-05-19 DIAGNOSIS — J449 Chronic obstructive pulmonary disease, unspecified: Secondary | ICD-10-CM | POA: Diagnosis not present

## 2014-05-19 DIAGNOSIS — E782 Mixed hyperlipidemia: Secondary | ICD-10-CM | POA: Diagnosis not present

## 2014-05-19 DIAGNOSIS — Z7901 Long term (current) use of anticoagulants: Secondary | ICD-10-CM | POA: Diagnosis not present

## 2014-06-13 DIAGNOSIS — Z720 Tobacco use: Secondary | ICD-10-CM | POA: Diagnosis not present

## 2014-06-13 DIAGNOSIS — J9622 Acute and chronic respiratory failure with hypercapnia: Secondary | ICD-10-CM | POA: Diagnosis not present

## 2014-06-13 DIAGNOSIS — J9801 Acute bronchospasm: Secondary | ICD-10-CM | POA: Diagnosis not present

## 2014-06-13 DIAGNOSIS — Z87891 Personal history of nicotine dependence: Secondary | ICD-10-CM | POA: Diagnosis not present

## 2014-06-13 DIAGNOSIS — J441 Chronic obstructive pulmonary disease with (acute) exacerbation: Secondary | ICD-10-CM | POA: Diagnosis not present

## 2014-06-13 DIAGNOSIS — J9601 Acute respiratory failure with hypoxia: Secondary | ICD-10-CM | POA: Diagnosis not present

## 2014-06-13 DIAGNOSIS — J9811 Atelectasis: Secondary | ICD-10-CM | POA: Diagnosis not present

## 2014-06-13 DIAGNOSIS — I5022 Chronic systolic (congestive) heart failure: Secondary | ICD-10-CM | POA: Diagnosis not present

## 2014-06-13 DIAGNOSIS — J9621 Acute and chronic respiratory failure with hypoxia: Secondary | ICD-10-CM | POA: Diagnosis not present

## 2014-06-13 DIAGNOSIS — R0600 Dyspnea, unspecified: Secondary | ICD-10-CM | POA: Diagnosis not present

## 2014-06-13 DIAGNOSIS — F17210 Nicotine dependence, cigarettes, uncomplicated: Secondary | ICD-10-CM | POA: Diagnosis not present

## 2014-06-13 DIAGNOSIS — J9602 Acute respiratory failure with hypercapnia: Secondary | ICD-10-CM | POA: Diagnosis not present

## 2014-06-13 DIAGNOSIS — R0602 Shortness of breath: Secondary | ICD-10-CM | POA: Diagnosis not present

## 2014-06-13 DIAGNOSIS — J9600 Acute respiratory failure, unspecified whether with hypoxia or hypercapnia: Secondary | ICD-10-CM | POA: Diagnosis not present

## 2014-06-13 DIAGNOSIS — N183 Chronic kidney disease, stage 3 (moderate): Secondary | ICD-10-CM | POA: Diagnosis not present

## 2014-06-18 DIAGNOSIS — Z7901 Long term (current) use of anticoagulants: Secondary | ICD-10-CM | POA: Diagnosis not present

## 2014-06-18 DIAGNOSIS — Z6836 Body mass index (BMI) 36.0-36.9, adult: Secondary | ICD-10-CM | POA: Diagnosis not present

## 2014-06-18 DIAGNOSIS — J189 Pneumonia, unspecified organism: Secondary | ICD-10-CM | POA: Diagnosis not present

## 2014-06-18 DIAGNOSIS — J449 Chronic obstructive pulmonary disease, unspecified: Secondary | ICD-10-CM | POA: Diagnosis not present

## 2014-07-16 DIAGNOSIS — R369 Urethral discharge, unspecified: Secondary | ICD-10-CM | POA: Diagnosis not present

## 2014-07-16 DIAGNOSIS — J449 Chronic obstructive pulmonary disease, unspecified: Secondary | ICD-10-CM | POA: Diagnosis not present

## 2014-07-16 DIAGNOSIS — I5042 Chronic combined systolic (congestive) and diastolic (congestive) heart failure: Secondary | ICD-10-CM | POA: Diagnosis not present

## 2014-07-16 DIAGNOSIS — Z2821 Immunization not carried out because of patient refusal: Secondary | ICD-10-CM | POA: Diagnosis not present

## 2014-07-16 DIAGNOSIS — Z6837 Body mass index (BMI) 37.0-37.9, adult: Secondary | ICD-10-CM | POA: Diagnosis not present

## 2014-07-16 DIAGNOSIS — Z7901 Long term (current) use of anticoagulants: Secondary | ICD-10-CM | POA: Diagnosis not present

## 2014-07-23 DIAGNOSIS — J449 Chronic obstructive pulmonary disease, unspecified: Secondary | ICD-10-CM | POA: Diagnosis not present

## 2014-07-23 DIAGNOSIS — J45909 Unspecified asthma, uncomplicated: Secondary | ICD-10-CM | POA: Diagnosis not present

## 2014-08-14 DIAGNOSIS — J449 Chronic obstructive pulmonary disease, unspecified: Secondary | ICD-10-CM | POA: Diagnosis not present

## 2014-08-14 DIAGNOSIS — E782 Mixed hyperlipidemia: Secondary | ICD-10-CM | POA: Diagnosis not present

## 2014-08-14 DIAGNOSIS — I5042 Chronic combined systolic (congestive) and diastolic (congestive) heart failure: Secondary | ICD-10-CM | POA: Diagnosis not present

## 2014-08-14 DIAGNOSIS — Z7901 Long term (current) use of anticoagulants: Secondary | ICD-10-CM | POA: Diagnosis not present

## 2014-09-03 DIAGNOSIS — N183 Chronic kidney disease, stage 3 (moderate): Secondary | ICD-10-CM | POA: Diagnosis not present

## 2014-09-07 DIAGNOSIS — N183 Chronic kidney disease, stage 3 (moderate): Secondary | ICD-10-CM | POA: Diagnosis not present

## 2014-09-07 DIAGNOSIS — F17200 Nicotine dependence, unspecified, uncomplicated: Secondary | ICD-10-CM | POA: Diagnosis not present

## 2014-09-14 DIAGNOSIS — Z6836 Body mass index (BMI) 36.0-36.9, adult: Secondary | ICD-10-CM | POA: Diagnosis not present

## 2014-09-14 DIAGNOSIS — J449 Chronic obstructive pulmonary disease, unspecified: Secondary | ICD-10-CM | POA: Diagnosis not present

## 2014-09-14 DIAGNOSIS — Z7901 Long term (current) use of anticoagulants: Secondary | ICD-10-CM | POA: Diagnosis not present

## 2014-09-14 DIAGNOSIS — J069 Acute upper respiratory infection, unspecified: Secondary | ICD-10-CM | POA: Diagnosis not present

## 2014-10-15 DIAGNOSIS — Z79899 Other long term (current) drug therapy: Secondary | ICD-10-CM | POA: Diagnosis not present

## 2014-10-15 DIAGNOSIS — Z7901 Long term (current) use of anticoagulants: Secondary | ICD-10-CM | POA: Diagnosis not present

## 2014-10-15 DIAGNOSIS — E782 Mixed hyperlipidemia: Secondary | ICD-10-CM | POA: Diagnosis not present

## 2014-10-15 DIAGNOSIS — J449 Chronic obstructive pulmonary disease, unspecified: Secondary | ICD-10-CM | POA: Diagnosis not present

## 2014-10-15 DIAGNOSIS — Z6836 Body mass index (BMI) 36.0-36.9, adult: Secondary | ICD-10-CM | POA: Diagnosis not present

## 2014-10-30 DIAGNOSIS — J45909 Unspecified asthma, uncomplicated: Secondary | ICD-10-CM | POA: Diagnosis not present

## 2014-10-30 DIAGNOSIS — R0602 Shortness of breath: Secondary | ICD-10-CM | POA: Diagnosis not present

## 2014-10-30 DIAGNOSIS — J449 Chronic obstructive pulmonary disease, unspecified: Secondary | ICD-10-CM | POA: Diagnosis not present

## 2014-11-18 DIAGNOSIS — E782 Mixed hyperlipidemia: Secondary | ICD-10-CM | POA: Diagnosis not present

## 2014-11-18 DIAGNOSIS — Z6837 Body mass index (BMI) 37.0-37.9, adult: Secondary | ICD-10-CM | POA: Diagnosis not present

## 2014-11-18 DIAGNOSIS — J449 Chronic obstructive pulmonary disease, unspecified: Secondary | ICD-10-CM | POA: Diagnosis not present

## 2014-11-18 DIAGNOSIS — Z7901 Long term (current) use of anticoagulants: Secondary | ICD-10-CM | POA: Diagnosis not present

## 2014-12-23 DIAGNOSIS — Z720 Tobacco use: Secondary | ICD-10-CM | POA: Diagnosis not present

## 2014-12-23 DIAGNOSIS — I5042 Chronic combined systolic (congestive) and diastolic (congestive) heart failure: Secondary | ICD-10-CM | POA: Diagnosis not present

## 2014-12-23 DIAGNOSIS — Z7901 Long term (current) use of anticoagulants: Secondary | ICD-10-CM | POA: Diagnosis not present

## 2014-12-23 DIAGNOSIS — F17200 Nicotine dependence, unspecified, uncomplicated: Secondary | ICD-10-CM | POA: Diagnosis not present

## 2014-12-23 DIAGNOSIS — Z6836 Body mass index (BMI) 36.0-36.9, adult: Secondary | ICD-10-CM | POA: Diagnosis not present

## 2014-12-23 DIAGNOSIS — Z5320 Procedure and treatment not carried out because of patient's decision for unspecified reasons: Secondary | ICD-10-CM | POA: Diagnosis not present

## 2014-12-23 DIAGNOSIS — J449 Chronic obstructive pulmonary disease, unspecified: Secondary | ICD-10-CM | POA: Diagnosis not present

## 2015-01-09 DIAGNOSIS — N183 Chronic kidney disease, stage 3 (moderate): Secondary | ICD-10-CM | POA: Diagnosis not present

## 2015-01-12 DIAGNOSIS — N183 Chronic kidney disease, stage 3 (moderate): Secondary | ICD-10-CM | POA: Diagnosis not present

## 2015-01-12 DIAGNOSIS — F17200 Nicotine dependence, unspecified, uncomplicated: Secondary | ICD-10-CM | POA: Diagnosis not present

## 2015-01-12 DIAGNOSIS — N179 Acute kidney failure, unspecified: Secondary | ICD-10-CM | POA: Diagnosis not present

## 2015-01-27 DIAGNOSIS — Z1389 Encounter for screening for other disorder: Secondary | ICD-10-CM | POA: Diagnosis not present

## 2015-01-27 DIAGNOSIS — Z6835 Body mass index (BMI) 35.0-35.9, adult: Secondary | ICD-10-CM | POA: Diagnosis not present

## 2015-01-27 DIAGNOSIS — E782 Mixed hyperlipidemia: Secondary | ICD-10-CM | POA: Diagnosis not present

## 2015-01-27 DIAGNOSIS — Z7901 Long term (current) use of anticoagulants: Secondary | ICD-10-CM | POA: Diagnosis not present

## 2015-01-27 DIAGNOSIS — J449 Chronic obstructive pulmonary disease, unspecified: Secondary | ICD-10-CM | POA: Diagnosis not present

## 2015-02-12 DIAGNOSIS — J449 Chronic obstructive pulmonary disease, unspecified: Secondary | ICD-10-CM | POA: Diagnosis not present

## 2015-02-12 DIAGNOSIS — R0602 Shortness of breath: Secondary | ICD-10-CM | POA: Diagnosis not present

## 2015-02-24 DIAGNOSIS — J069 Acute upper respiratory infection, unspecified: Secondary | ICD-10-CM | POA: Diagnosis not present

## 2015-02-24 DIAGNOSIS — Z7901 Long term (current) use of anticoagulants: Secondary | ICD-10-CM | POA: Diagnosis not present

## 2015-02-24 DIAGNOSIS — Z6835 Body mass index (BMI) 35.0-35.9, adult: Secondary | ICD-10-CM | POA: Diagnosis not present

## 2015-02-24 DIAGNOSIS — J449 Chronic obstructive pulmonary disease, unspecified: Secondary | ICD-10-CM | POA: Diagnosis not present

## 2015-03-30 DIAGNOSIS — J449 Chronic obstructive pulmonary disease, unspecified: Secondary | ICD-10-CM | POA: Diagnosis not present

## 2015-03-30 DIAGNOSIS — Z7901 Long term (current) use of anticoagulants: Secondary | ICD-10-CM | POA: Diagnosis not present

## 2015-03-30 DIAGNOSIS — E782 Mixed hyperlipidemia: Secondary | ICD-10-CM | POA: Diagnosis not present

## 2015-03-30 DIAGNOSIS — Z6836 Body mass index (BMI) 36.0-36.9, adult: Secondary | ICD-10-CM | POA: Diagnosis not present

## 2015-04-16 DIAGNOSIS — R791 Abnormal coagulation profile: Secondary | ICD-10-CM | POA: Diagnosis not present

## 2015-04-16 DIAGNOSIS — J449 Chronic obstructive pulmonary disease, unspecified: Secondary | ICD-10-CM | POA: Diagnosis not present

## 2015-04-16 DIAGNOSIS — Z6836 Body mass index (BMI) 36.0-36.9, adult: Secondary | ICD-10-CM | POA: Diagnosis not present

## 2015-04-16 DIAGNOSIS — E782 Mixed hyperlipidemia: Secondary | ICD-10-CM | POA: Diagnosis not present

## 2015-04-16 DIAGNOSIS — Z79899 Other long term (current) drug therapy: Secondary | ICD-10-CM | POA: Diagnosis not present

## 2015-05-17 DIAGNOSIS — N183 Chronic kidney disease, stage 3 (moderate): Secondary | ICD-10-CM | POA: Diagnosis not present

## 2015-05-19 DIAGNOSIS — Z7901 Long term (current) use of anticoagulants: Secondary | ICD-10-CM | POA: Diagnosis not present

## 2015-05-19 DIAGNOSIS — Z6836 Body mass index (BMI) 36.0-36.9, adult: Secondary | ICD-10-CM | POA: Diagnosis not present

## 2015-05-19 DIAGNOSIS — N183 Chronic kidney disease, stage 3 (moderate): Secondary | ICD-10-CM | POA: Diagnosis not present

## 2015-05-19 DIAGNOSIS — I5042 Chronic combined systolic (congestive) and diastolic (congestive) heart failure: Secondary | ICD-10-CM | POA: Diagnosis not present

## 2015-05-19 DIAGNOSIS — Z79899 Other long term (current) drug therapy: Secondary | ICD-10-CM | POA: Diagnosis not present

## 2015-05-19 DIAGNOSIS — J449 Chronic obstructive pulmonary disease, unspecified: Secondary | ICD-10-CM | POA: Diagnosis not present

## 2015-05-19 DIAGNOSIS — E782 Mixed hyperlipidemia: Secondary | ICD-10-CM | POA: Diagnosis not present

## 2015-05-21 DIAGNOSIS — Z7901 Long term (current) use of anticoagulants: Secondary | ICD-10-CM | POA: Diagnosis not present

## 2015-05-24 DIAGNOSIS — Z7901 Long term (current) use of anticoagulants: Secondary | ICD-10-CM | POA: Diagnosis not present

## 2015-05-27 DIAGNOSIS — Z952 Presence of prosthetic heart valve: Secondary | ICD-10-CM | POA: Diagnosis not present

## 2015-05-27 DIAGNOSIS — R0600 Dyspnea, unspecified: Secondary | ICD-10-CM | POA: Diagnosis not present

## 2015-05-27 DIAGNOSIS — Z9981 Dependence on supplemental oxygen: Secondary | ICD-10-CM | POA: Diagnosis not present

## 2015-05-27 DIAGNOSIS — I4891 Unspecified atrial fibrillation: Secondary | ICD-10-CM | POA: Diagnosis present

## 2015-05-27 DIAGNOSIS — Z79899 Other long term (current) drug therapy: Secondary | ICD-10-CM | POA: Diagnosis not present

## 2015-05-27 DIAGNOSIS — I5022 Chronic systolic (congestive) heart failure: Secondary | ICD-10-CM | POA: Diagnosis not present

## 2015-05-27 DIAGNOSIS — J9621 Acute and chronic respiratory failure with hypoxia: Secondary | ICD-10-CM | POA: Diagnosis not present

## 2015-05-27 DIAGNOSIS — J9602 Acute respiratory failure with hypercapnia: Secondary | ICD-10-CM | POA: Diagnosis not present

## 2015-05-27 DIAGNOSIS — E872 Acidosis: Secondary | ICD-10-CM | POA: Diagnosis not present

## 2015-05-27 DIAGNOSIS — J45909 Unspecified asthma, uncomplicated: Secondary | ICD-10-CM | POA: Diagnosis present

## 2015-05-27 DIAGNOSIS — G4733 Obstructive sleep apnea (adult) (pediatric): Secondary | ICD-10-CM | POA: Diagnosis present

## 2015-05-27 DIAGNOSIS — Z7901 Long term (current) use of anticoagulants: Secondary | ICD-10-CM | POA: Diagnosis not present

## 2015-05-27 DIAGNOSIS — F17210 Nicotine dependence, cigarettes, uncomplicated: Secondary | ICD-10-CM | POA: Diagnosis present

## 2015-05-27 DIAGNOSIS — J189 Pneumonia, unspecified organism: Secondary | ICD-10-CM | POA: Diagnosis not present

## 2015-05-27 DIAGNOSIS — J9622 Acute and chronic respiratory failure with hypercapnia: Secondary | ICD-10-CM | POA: Diagnosis present

## 2015-05-27 DIAGNOSIS — N183 Chronic kidney disease, stage 3 (moderate): Secondary | ICD-10-CM | POA: Diagnosis present

## 2015-05-27 DIAGNOSIS — R0602 Shortness of breath: Secondary | ICD-10-CM | POA: Diagnosis not present

## 2015-05-27 DIAGNOSIS — F17200 Nicotine dependence, unspecified, uncomplicated: Secondary | ICD-10-CM | POA: Diagnosis not present

## 2015-05-27 DIAGNOSIS — J441 Chronic obstructive pulmonary disease with (acute) exacerbation: Secondary | ICD-10-CM | POA: Diagnosis not present

## 2015-05-27 DIAGNOSIS — J9600 Acute respiratory failure, unspecified whether with hypoxia or hypercapnia: Secondary | ICD-10-CM | POA: Diagnosis not present

## 2015-06-01 DIAGNOSIS — J449 Chronic obstructive pulmonary disease, unspecified: Secondary | ICD-10-CM | POA: Diagnosis not present

## 2015-06-01 DIAGNOSIS — J209 Acute bronchitis, unspecified: Secondary | ICD-10-CM | POA: Diagnosis not present

## 2015-06-01 DIAGNOSIS — Z6835 Body mass index (BMI) 35.0-35.9, adult: Secondary | ICD-10-CM | POA: Diagnosis not present

## 2015-06-02 DIAGNOSIS — N179 Acute kidney failure, unspecified: Secondary | ICD-10-CM | POA: Diagnosis not present

## 2015-06-02 DIAGNOSIS — J441 Chronic obstructive pulmonary disease with (acute) exacerbation: Secondary | ICD-10-CM | POA: Diagnosis not present

## 2015-06-02 DIAGNOSIS — N183 Chronic kidney disease, stage 3 (moderate): Secondary | ICD-10-CM | POA: Diagnosis not present

## 2015-06-07 DIAGNOSIS — J449 Chronic obstructive pulmonary disease, unspecified: Secondary | ICD-10-CM | POA: Diagnosis not present

## 2015-06-07 DIAGNOSIS — Z6834 Body mass index (BMI) 34.0-34.9, adult: Secondary | ICD-10-CM | POA: Diagnosis not present

## 2015-06-07 DIAGNOSIS — R791 Abnormal coagulation profile: Secondary | ICD-10-CM | POA: Diagnosis not present

## 2015-06-07 DIAGNOSIS — Z209 Contact with and (suspected) exposure to unspecified communicable disease: Secondary | ICD-10-CM | POA: Diagnosis not present

## 2015-06-16 DIAGNOSIS — J449 Chronic obstructive pulmonary disease, unspecified: Secondary | ICD-10-CM | POA: Diagnosis not present

## 2015-06-16 DIAGNOSIS — Z79899 Other long term (current) drug therapy: Secondary | ICD-10-CM | POA: Diagnosis not present

## 2015-06-16 DIAGNOSIS — J9801 Acute bronchospasm: Secondary | ICD-10-CM | POA: Diagnosis not present

## 2015-06-16 DIAGNOSIS — Z13820 Encounter for screening for osteoporosis: Secondary | ICD-10-CM | POA: Diagnosis not present

## 2015-07-08 DIAGNOSIS — E663 Overweight: Secondary | ICD-10-CM | POA: Diagnosis not present

## 2015-07-08 DIAGNOSIS — J449 Chronic obstructive pulmonary disease, unspecified: Secondary | ICD-10-CM | POA: Diagnosis not present

## 2015-07-08 DIAGNOSIS — Z6836 Body mass index (BMI) 36.0-36.9, adult: Secondary | ICD-10-CM | POA: Diagnosis not present

## 2015-07-08 DIAGNOSIS — E782 Mixed hyperlipidemia: Secondary | ICD-10-CM | POA: Diagnosis not present

## 2015-07-08 DIAGNOSIS — Z7901 Long term (current) use of anticoagulants: Secondary | ICD-10-CM | POA: Diagnosis not present

## 2015-08-04 DIAGNOSIS — Z79899 Other long term (current) drug therapy: Secondary | ICD-10-CM | POA: Diagnosis not present

## 2015-08-04 DIAGNOSIS — E782 Mixed hyperlipidemia: Secondary | ICD-10-CM | POA: Diagnosis not present

## 2015-08-04 DIAGNOSIS — Z1389 Encounter for screening for other disorder: Secondary | ICD-10-CM | POA: Diagnosis not present

## 2015-08-04 DIAGNOSIS — Z125 Encounter for screening for malignant neoplasm of prostate: Secondary | ICD-10-CM | POA: Diagnosis not present

## 2015-08-04 DIAGNOSIS — Z7901 Long term (current) use of anticoagulants: Secondary | ICD-10-CM | POA: Diagnosis not present

## 2015-08-04 DIAGNOSIS — J449 Chronic obstructive pulmonary disease, unspecified: Secondary | ICD-10-CM | POA: Diagnosis not present

## 2015-08-04 DIAGNOSIS — Z6835 Body mass index (BMI) 35.0-35.9, adult: Secondary | ICD-10-CM | POA: Diagnosis not present

## 2015-08-04 DIAGNOSIS — J069 Acute upper respiratory infection, unspecified: Secondary | ICD-10-CM | POA: Diagnosis not present

## 2015-08-23 DIAGNOSIS — J449 Chronic obstructive pulmonary disease, unspecified: Secondary | ICD-10-CM | POA: Diagnosis not present

## 2015-08-23 DIAGNOSIS — Z6836 Body mass index (BMI) 36.0-36.9, adult: Secondary | ICD-10-CM | POA: Diagnosis not present

## 2015-08-23 DIAGNOSIS — J069 Acute upper respiratory infection, unspecified: Secondary | ICD-10-CM | POA: Diagnosis not present

## 2015-09-01 DIAGNOSIS — Z6835 Body mass index (BMI) 35.0-35.9, adult: Secondary | ICD-10-CM | POA: Diagnosis not present

## 2015-09-01 DIAGNOSIS — E782 Mixed hyperlipidemia: Secondary | ICD-10-CM | POA: Diagnosis not present

## 2015-09-01 DIAGNOSIS — J449 Chronic obstructive pulmonary disease, unspecified: Secondary | ICD-10-CM | POA: Diagnosis not present

## 2015-09-01 DIAGNOSIS — Z1389 Encounter for screening for other disorder: Secondary | ICD-10-CM | POA: Diagnosis not present

## 2015-09-01 DIAGNOSIS — Z7901 Long term (current) use of anticoagulants: Secondary | ICD-10-CM | POA: Diagnosis not present

## 2015-09-16 DIAGNOSIS — R0602 Shortness of breath: Secondary | ICD-10-CM | POA: Diagnosis not present

## 2015-09-16 DIAGNOSIS — J449 Chronic obstructive pulmonary disease, unspecified: Secondary | ICD-10-CM | POA: Diagnosis not present

## 2015-10-07 DIAGNOSIS — N183 Chronic kidney disease, stage 3 (moderate): Secondary | ICD-10-CM | POA: Diagnosis not present

## 2015-10-08 DIAGNOSIS — I509 Heart failure, unspecified: Secondary | ICD-10-CM | POA: Diagnosis not present

## 2015-10-08 DIAGNOSIS — Z7901 Long term (current) use of anticoagulants: Secondary | ICD-10-CM | POA: Diagnosis not present

## 2015-10-08 DIAGNOSIS — Z952 Presence of prosthetic heart valve: Secondary | ICD-10-CM | POA: Diagnosis not present

## 2015-10-08 DIAGNOSIS — F17210 Nicotine dependence, cigarettes, uncomplicated: Secondary | ICD-10-CM | POA: Diagnosis not present

## 2015-10-08 DIAGNOSIS — N183 Chronic kidney disease, stage 3 (moderate): Secondary | ICD-10-CM | POA: Diagnosis not present

## 2015-10-19 DIAGNOSIS — E782 Mixed hyperlipidemia: Secondary | ICD-10-CM | POA: Diagnosis not present

## 2015-10-19 DIAGNOSIS — Z6836 Body mass index (BMI) 36.0-36.9, adult: Secondary | ICD-10-CM | POA: Diagnosis not present

## 2015-10-19 DIAGNOSIS — Z7901 Long term (current) use of anticoagulants: Secondary | ICD-10-CM | POA: Diagnosis not present

## 2015-10-21 DIAGNOSIS — J449 Chronic obstructive pulmonary disease, unspecified: Secondary | ICD-10-CM | POA: Diagnosis not present

## 2015-10-21 DIAGNOSIS — J45909 Unspecified asthma, uncomplicated: Secondary | ICD-10-CM | POA: Diagnosis not present

## 2015-11-23 DIAGNOSIS — E782 Mixed hyperlipidemia: Secondary | ICD-10-CM | POA: Diagnosis not present

## 2015-11-23 DIAGNOSIS — J069 Acute upper respiratory infection, unspecified: Secondary | ICD-10-CM | POA: Diagnosis not present

## 2015-11-23 DIAGNOSIS — Z79899 Other long term (current) drug therapy: Secondary | ICD-10-CM | POA: Diagnosis not present

## 2015-11-23 DIAGNOSIS — Z6837 Body mass index (BMI) 37.0-37.9, adult: Secondary | ICD-10-CM | POA: Diagnosis not present

## 2015-11-23 DIAGNOSIS — Z7901 Long term (current) use of anticoagulants: Secondary | ICD-10-CM | POA: Diagnosis not present

## 2015-11-23 DIAGNOSIS — E559 Vitamin D deficiency, unspecified: Secondary | ICD-10-CM | POA: Diagnosis not present

## 2015-11-23 DIAGNOSIS — J449 Chronic obstructive pulmonary disease, unspecified: Secondary | ICD-10-CM | POA: Diagnosis not present

## 2015-12-22 DIAGNOSIS — Z6837 Body mass index (BMI) 37.0-37.9, adult: Secondary | ICD-10-CM | POA: Diagnosis not present

## 2015-12-22 DIAGNOSIS — Z7901 Long term (current) use of anticoagulants: Secondary | ICD-10-CM | POA: Diagnosis not present

## 2015-12-22 DIAGNOSIS — L0390 Cellulitis, unspecified: Secondary | ICD-10-CM | POA: Diagnosis not present

## 2015-12-22 DIAGNOSIS — N183 Chronic kidney disease, stage 3 (moderate): Secondary | ICD-10-CM | POA: Diagnosis not present

## 2015-12-22 DIAGNOSIS — E782 Mixed hyperlipidemia: Secondary | ICD-10-CM | POA: Diagnosis not present

## 2015-12-22 DIAGNOSIS — M722 Plantar fascial fibromatosis: Secondary | ICD-10-CM | POA: Diagnosis not present

## 2016-01-20 DIAGNOSIS — R0602 Shortness of breath: Secondary | ICD-10-CM | POA: Diagnosis not present

## 2016-01-20 DIAGNOSIS — J209 Acute bronchitis, unspecified: Secondary | ICD-10-CM | POA: Diagnosis not present

## 2016-01-24 DIAGNOSIS — Z7901 Long term (current) use of anticoagulants: Secondary | ICD-10-CM | POA: Diagnosis not present

## 2016-01-24 DIAGNOSIS — E663 Overweight: Secondary | ICD-10-CM | POA: Diagnosis not present

## 2016-01-24 DIAGNOSIS — Z6836 Body mass index (BMI) 36.0-36.9, adult: Secondary | ICD-10-CM | POA: Diagnosis not present

## 2016-01-24 DIAGNOSIS — J449 Chronic obstructive pulmonary disease, unspecified: Secondary | ICD-10-CM | POA: Diagnosis not present

## 2016-02-01 DIAGNOSIS — J449 Chronic obstructive pulmonary disease, unspecified: Secondary | ICD-10-CM | POA: Diagnosis not present

## 2016-02-01 DIAGNOSIS — J45909 Unspecified asthma, uncomplicated: Secondary | ICD-10-CM | POA: Diagnosis not present

## 2016-02-02 DIAGNOSIS — Z7901 Long term (current) use of anticoagulants: Secondary | ICD-10-CM | POA: Diagnosis not present

## 2016-02-09 DIAGNOSIS — N183 Chronic kidney disease, stage 3 (moderate): Secondary | ICD-10-CM | POA: Diagnosis not present

## 2016-02-10 DIAGNOSIS — Z720 Tobacco use: Secondary | ICD-10-CM | POA: Diagnosis not present

## 2016-02-10 DIAGNOSIS — N183 Chronic kidney disease, stage 3 (moderate): Secondary | ICD-10-CM | POA: Diagnosis not present

## 2016-02-23 DIAGNOSIS — J449 Chronic obstructive pulmonary disease, unspecified: Secondary | ICD-10-CM | POA: Diagnosis not present

## 2016-02-23 DIAGNOSIS — Z6836 Body mass index (BMI) 36.0-36.9, adult: Secondary | ICD-10-CM | POA: Diagnosis not present

## 2016-02-23 DIAGNOSIS — F17200 Nicotine dependence, unspecified, uncomplicated: Secondary | ICD-10-CM | POA: Diagnosis not present

## 2016-02-23 DIAGNOSIS — Z7901 Long term (current) use of anticoagulants: Secondary | ICD-10-CM | POA: Diagnosis not present

## 2016-02-23 DIAGNOSIS — Z720 Tobacco use: Secondary | ICD-10-CM | POA: Diagnosis not present

## 2016-02-28 DIAGNOSIS — R369 Urethral discharge, unspecified: Secondary | ICD-10-CM | POA: Diagnosis not present

## 2016-02-28 DIAGNOSIS — Z6836 Body mass index (BMI) 36.0-36.9, adult: Secondary | ICD-10-CM | POA: Diagnosis not present

## 2016-02-28 DIAGNOSIS — Z7721 Contact with and (suspected) exposure to potentially hazardous body fluids: Secondary | ICD-10-CM | POA: Diagnosis not present

## 2016-03-27 DIAGNOSIS — Z79899 Other long term (current) drug therapy: Secondary | ICD-10-CM | POA: Diagnosis not present

## 2016-03-27 DIAGNOSIS — J449 Chronic obstructive pulmonary disease, unspecified: Secondary | ICD-10-CM | POA: Diagnosis not present

## 2016-03-27 DIAGNOSIS — E559 Vitamin D deficiency, unspecified: Secondary | ICD-10-CM | POA: Diagnosis not present

## 2016-03-27 DIAGNOSIS — Z7901 Long term (current) use of anticoagulants: Secondary | ICD-10-CM | POA: Diagnosis not present

## 2016-03-27 DIAGNOSIS — E782 Mixed hyperlipidemia: Secondary | ICD-10-CM | POA: Diagnosis not present

## 2016-03-27 DIAGNOSIS — Z6836 Body mass index (BMI) 36.0-36.9, adult: Secondary | ICD-10-CM | POA: Diagnosis not present

## 2016-04-28 DIAGNOSIS — Z7901 Long term (current) use of anticoagulants: Secondary | ICD-10-CM | POA: Diagnosis not present

## 2016-04-28 DIAGNOSIS — Z6837 Body mass index (BMI) 37.0-37.9, adult: Secondary | ICD-10-CM | POA: Diagnosis not present

## 2016-04-28 DIAGNOSIS — E782 Mixed hyperlipidemia: Secondary | ICD-10-CM | POA: Diagnosis not present

## 2016-04-28 DIAGNOSIS — E559 Vitamin D deficiency, unspecified: Secondary | ICD-10-CM | POA: Diagnosis not present

## 2016-04-28 DIAGNOSIS — N183 Chronic kidney disease, stage 3 (moderate): Secondary | ICD-10-CM | POA: Diagnosis not present

## 2016-05-15 DIAGNOSIS — J449 Chronic obstructive pulmonary disease, unspecified: Secondary | ICD-10-CM | POA: Diagnosis not present

## 2016-05-15 DIAGNOSIS — J9801 Acute bronchospasm: Secondary | ICD-10-CM | POA: Diagnosis not present

## 2016-05-15 DIAGNOSIS — R0602 Shortness of breath: Secondary | ICD-10-CM | POA: Diagnosis not present

## 2016-06-01 DIAGNOSIS — N183 Chronic kidney disease, stage 3 (moderate): Secondary | ICD-10-CM | POA: Diagnosis not present

## 2016-06-07 DIAGNOSIS — J449 Chronic obstructive pulmonary disease, unspecified: Secondary | ICD-10-CM | POA: Diagnosis not present

## 2016-06-07 DIAGNOSIS — E782 Mixed hyperlipidemia: Secondary | ICD-10-CM | POA: Diagnosis not present

## 2016-06-07 DIAGNOSIS — Z7901 Long term (current) use of anticoagulants: Secondary | ICD-10-CM | POA: Diagnosis not present

## 2016-06-12 DIAGNOSIS — N183 Chronic kidney disease, stage 3 (moderate): Secondary | ICD-10-CM | POA: Diagnosis not present

## 2016-06-12 DIAGNOSIS — Z720 Tobacco use: Secondary | ICD-10-CM | POA: Diagnosis not present

## 2016-06-12 DIAGNOSIS — E669 Obesity, unspecified: Secondary | ICD-10-CM | POA: Diagnosis not present

## 2016-07-11 DIAGNOSIS — E559 Vitamin D deficiency, unspecified: Secondary | ICD-10-CM | POA: Diagnosis not present

## 2016-07-11 DIAGNOSIS — Z7901 Long term (current) use of anticoagulants: Secondary | ICD-10-CM | POA: Diagnosis not present

## 2016-07-11 DIAGNOSIS — E663 Overweight: Secondary | ICD-10-CM | POA: Diagnosis not present

## 2016-07-11 DIAGNOSIS — Z79899 Other long term (current) drug therapy: Secondary | ICD-10-CM | POA: Diagnosis not present

## 2016-07-11 DIAGNOSIS — Z6837 Body mass index (BMI) 37.0-37.9, adult: Secondary | ICD-10-CM | POA: Diagnosis not present

## 2016-07-11 DIAGNOSIS — E782 Mixed hyperlipidemia: Secondary | ICD-10-CM | POA: Diagnosis not present

## 2016-08-15 DIAGNOSIS — J45909 Unspecified asthma, uncomplicated: Secondary | ICD-10-CM | POA: Diagnosis not present

## 2016-08-15 DIAGNOSIS — J449 Chronic obstructive pulmonary disease, unspecified: Secondary | ICD-10-CM | POA: Diagnosis not present

## 2016-08-16 DIAGNOSIS — Z7901 Long term (current) use of anticoagulants: Secondary | ICD-10-CM | POA: Diagnosis not present

## 2016-08-16 DIAGNOSIS — E559 Vitamin D deficiency, unspecified: Secondary | ICD-10-CM | POA: Diagnosis not present

## 2016-08-16 DIAGNOSIS — N183 Chronic kidney disease, stage 3 (moderate): Secondary | ICD-10-CM | POA: Diagnosis not present

## 2016-08-16 DIAGNOSIS — E782 Mixed hyperlipidemia: Secondary | ICD-10-CM | POA: Diagnosis not present

## 2016-08-16 DIAGNOSIS — Z6837 Body mass index (BMI) 37.0-37.9, adult: Secondary | ICD-10-CM | POA: Diagnosis not present

## 2016-08-16 DIAGNOSIS — J449 Chronic obstructive pulmonary disease, unspecified: Secondary | ICD-10-CM | POA: Diagnosis not present

## 2016-09-08 DIAGNOSIS — R791 Abnormal coagulation profile: Secondary | ICD-10-CM | POA: Diagnosis not present

## 2016-09-08 DIAGNOSIS — J449 Chronic obstructive pulmonary disease, unspecified: Secondary | ICD-10-CM | POA: Diagnosis not present

## 2016-09-08 DIAGNOSIS — J069 Acute upper respiratory infection, unspecified: Secondary | ICD-10-CM | POA: Diagnosis not present

## 2016-09-08 DIAGNOSIS — Z6837 Body mass index (BMI) 37.0-37.9, adult: Secondary | ICD-10-CM | POA: Diagnosis not present

## 2016-10-10 DIAGNOSIS — Z79899 Other long term (current) drug therapy: Secondary | ICD-10-CM | POA: Diagnosis not present

## 2016-10-10 DIAGNOSIS — E782 Mixed hyperlipidemia: Secondary | ICD-10-CM | POA: Diagnosis not present

## 2016-10-10 DIAGNOSIS — J449 Chronic obstructive pulmonary disease, unspecified: Secondary | ICD-10-CM | POA: Diagnosis not present

## 2016-10-10 DIAGNOSIS — E559 Vitamin D deficiency, unspecified: Secondary | ICD-10-CM | POA: Diagnosis not present

## 2016-10-10 DIAGNOSIS — Z6837 Body mass index (BMI) 37.0-37.9, adult: Secondary | ICD-10-CM | POA: Diagnosis not present

## 2016-10-10 DIAGNOSIS — R791 Abnormal coagulation profile: Secondary | ICD-10-CM | POA: Diagnosis not present

## 2016-10-10 DIAGNOSIS — Z1389 Encounter for screening for other disorder: Secondary | ICD-10-CM | POA: Diagnosis not present

## 2016-10-13 DIAGNOSIS — N183 Chronic kidney disease, stage 3 (moderate): Secondary | ICD-10-CM | POA: Diagnosis not present

## 2016-10-16 DIAGNOSIS — Z720 Tobacco use: Secondary | ICD-10-CM | POA: Diagnosis not present

## 2016-10-16 DIAGNOSIS — N183 Chronic kidney disease, stage 3 (moderate): Secondary | ICD-10-CM | POA: Diagnosis not present

## 2016-10-16 DIAGNOSIS — E669 Obesity, unspecified: Secondary | ICD-10-CM | POA: Diagnosis not present

## 2016-10-18 DIAGNOSIS — J449 Chronic obstructive pulmonary disease, unspecified: Secondary | ICD-10-CM | POA: Diagnosis not present

## 2016-10-18 DIAGNOSIS — J45909 Unspecified asthma, uncomplicated: Secondary | ICD-10-CM | POA: Diagnosis not present

## 2016-11-10 DIAGNOSIS — Z6837 Body mass index (BMI) 37.0-37.9, adult: Secondary | ICD-10-CM | POA: Diagnosis not present

## 2016-11-10 DIAGNOSIS — R791 Abnormal coagulation profile: Secondary | ICD-10-CM | POA: Diagnosis not present

## 2016-11-10 DIAGNOSIS — N183 Chronic kidney disease, stage 3 (moderate): Secondary | ICD-10-CM | POA: Diagnosis not present

## 2016-11-10 DIAGNOSIS — I5042 Chronic combined systolic (congestive) and diastolic (congestive) heart failure: Secondary | ICD-10-CM | POA: Diagnosis not present

## 2016-11-10 DIAGNOSIS — E559 Vitamin D deficiency, unspecified: Secondary | ICD-10-CM | POA: Diagnosis not present

## 2016-11-10 DIAGNOSIS — E782 Mixed hyperlipidemia: Secondary | ICD-10-CM | POA: Diagnosis not present

## 2016-12-27 DIAGNOSIS — E669 Obesity, unspecified: Secondary | ICD-10-CM | POA: Diagnosis not present

## 2016-12-27 DIAGNOSIS — Z6837 Body mass index (BMI) 37.0-37.9, adult: Secondary | ICD-10-CM | POA: Diagnosis not present

## 2016-12-27 DIAGNOSIS — J449 Chronic obstructive pulmonary disease, unspecified: Secondary | ICD-10-CM | POA: Diagnosis not present

## 2016-12-27 DIAGNOSIS — H539 Unspecified visual disturbance: Secondary | ICD-10-CM | POA: Diagnosis not present

## 2016-12-27 DIAGNOSIS — Z7901 Long term (current) use of anticoagulants: Secondary | ICD-10-CM | POA: Diagnosis not present

## 2017-01-30 DIAGNOSIS — E782 Mixed hyperlipidemia: Secondary | ICD-10-CM | POA: Diagnosis not present

## 2017-01-30 DIAGNOSIS — J449 Chronic obstructive pulmonary disease, unspecified: Secondary | ICD-10-CM | POA: Diagnosis not present

## 2017-01-30 DIAGNOSIS — Z6836 Body mass index (BMI) 36.0-36.9, adult: Secondary | ICD-10-CM | POA: Diagnosis not present

## 2017-01-30 DIAGNOSIS — Z7901 Long term (current) use of anticoagulants: Secondary | ICD-10-CM | POA: Diagnosis not present

## 2017-01-31 DIAGNOSIS — H25013 Cortical age-related cataract, bilateral: Secondary | ICD-10-CM | POA: Diagnosis not present

## 2017-02-15 DIAGNOSIS — J45909 Unspecified asthma, uncomplicated: Secondary | ICD-10-CM | POA: Diagnosis not present

## 2017-02-15 DIAGNOSIS — F17200 Nicotine dependence, unspecified, uncomplicated: Secondary | ICD-10-CM | POA: Diagnosis not present

## 2017-02-15 DIAGNOSIS — J449 Chronic obstructive pulmonary disease, unspecified: Secondary | ICD-10-CM | POA: Diagnosis not present

## 2017-02-15 DIAGNOSIS — R0602 Shortness of breath: Secondary | ICD-10-CM | POA: Diagnosis not present

## 2017-02-15 DIAGNOSIS — Z87891 Personal history of nicotine dependence: Secondary | ICD-10-CM | POA: Diagnosis not present

## 2017-02-16 DIAGNOSIS — Z0000 Encounter for general adult medical examination without abnormal findings: Secondary | ICD-10-CM | POA: Diagnosis not present

## 2017-02-16 DIAGNOSIS — E785 Hyperlipidemia, unspecified: Secondary | ICD-10-CM | POA: Diagnosis not present

## 2017-02-16 DIAGNOSIS — Z1389 Encounter for screening for other disorder: Secondary | ICD-10-CM | POA: Diagnosis not present

## 2017-02-16 DIAGNOSIS — Z125 Encounter for screening for malignant neoplasm of prostate: Secondary | ICD-10-CM | POA: Diagnosis not present

## 2017-02-16 DIAGNOSIS — Z9181 History of falling: Secondary | ICD-10-CM | POA: Diagnosis not present

## 2017-02-16 DIAGNOSIS — Z6836 Body mass index (BMI) 36.0-36.9, adult: Secondary | ICD-10-CM | POA: Diagnosis not present

## 2017-02-19 DIAGNOSIS — N183 Chronic kidney disease, stage 3 (moderate): Secondary | ICD-10-CM | POA: Diagnosis not present

## 2017-02-21 DIAGNOSIS — N183 Chronic kidney disease, stage 3 (moderate): Secondary | ICD-10-CM | POA: Diagnosis not present

## 2017-02-21 DIAGNOSIS — Z720 Tobacco use: Secondary | ICD-10-CM | POA: Diagnosis not present

## 2017-02-21 DIAGNOSIS — E669 Obesity, unspecified: Secondary | ICD-10-CM | POA: Diagnosis not present

## 2017-03-08 DIAGNOSIS — Z79899 Other long term (current) drug therapy: Secondary | ICD-10-CM | POA: Diagnosis not present

## 2017-03-08 DIAGNOSIS — Z7901 Long term (current) use of anticoagulants: Secondary | ICD-10-CM | POA: Diagnosis not present

## 2017-03-08 DIAGNOSIS — Z720 Tobacco use: Secondary | ICD-10-CM | POA: Diagnosis not present

## 2017-03-08 DIAGNOSIS — Z125 Encounter for screening for malignant neoplasm of prostate: Secondary | ICD-10-CM | POA: Diagnosis not present

## 2017-03-08 DIAGNOSIS — E782 Mixed hyperlipidemia: Secondary | ICD-10-CM | POA: Diagnosis not present

## 2017-03-08 DIAGNOSIS — Z6836 Body mass index (BMI) 36.0-36.9, adult: Secondary | ICD-10-CM | POA: Diagnosis not present

## 2017-03-08 DIAGNOSIS — E559 Vitamin D deficiency, unspecified: Secondary | ICD-10-CM | POA: Diagnosis not present

## 2017-03-08 DIAGNOSIS — J449 Chronic obstructive pulmonary disease, unspecified: Secondary | ICD-10-CM | POA: Diagnosis not present

## 2017-04-09 DIAGNOSIS — S00419A Abrasion of unspecified ear, initial encounter: Secondary | ICD-10-CM | POA: Diagnosis not present

## 2017-04-09 DIAGNOSIS — E782 Mixed hyperlipidemia: Secondary | ICD-10-CM | POA: Diagnosis not present

## 2017-04-09 DIAGNOSIS — E559 Vitamin D deficiency, unspecified: Secondary | ICD-10-CM | POA: Diagnosis not present

## 2017-04-09 DIAGNOSIS — Z7901 Long term (current) use of anticoagulants: Secondary | ICD-10-CM | POA: Diagnosis not present

## 2017-04-09 DIAGNOSIS — Z6837 Body mass index (BMI) 37.0-37.9, adult: Secondary | ICD-10-CM | POA: Diagnosis not present

## 2017-04-09 DIAGNOSIS — I5042 Chronic combined systolic (congestive) and diastolic (congestive) heart failure: Secondary | ICD-10-CM | POA: Diagnosis not present

## 2017-05-11 DIAGNOSIS — R791 Abnormal coagulation profile: Secondary | ICD-10-CM | POA: Diagnosis not present

## 2017-05-11 DIAGNOSIS — R112 Nausea with vomiting, unspecified: Secondary | ICD-10-CM | POA: Diagnosis not present

## 2017-05-11 DIAGNOSIS — N3001 Acute cystitis with hematuria: Secondary | ICD-10-CM | POA: Diagnosis not present

## 2017-05-11 DIAGNOSIS — Z79899 Other long term (current) drug therapy: Secondary | ICD-10-CM | POA: Diagnosis not present

## 2017-05-11 DIAGNOSIS — D62 Acute posthemorrhagic anemia: Secondary | ICD-10-CM | POA: Diagnosis not present

## 2017-05-11 DIAGNOSIS — N179 Acute kidney failure, unspecified: Secondary | ICD-10-CM | POA: Diagnosis not present

## 2017-05-11 DIAGNOSIS — K2980 Duodenitis without bleeding: Secondary | ICD-10-CM | POA: Diagnosis not present

## 2017-05-11 DIAGNOSIS — R0602 Shortness of breath: Secondary | ICD-10-CM | POA: Diagnosis not present

## 2017-05-11 DIAGNOSIS — F17210 Nicotine dependence, cigarettes, uncomplicated: Secondary | ICD-10-CM | POA: Diagnosis not present

## 2017-05-11 DIAGNOSIS — J9610 Chronic respiratory failure, unspecified whether with hypoxia or hypercapnia: Secondary | ICD-10-CM | POA: Diagnosis not present

## 2017-05-11 DIAGNOSIS — J449 Chronic obstructive pulmonary disease, unspecified: Secondary | ICD-10-CM | POA: Diagnosis not present

## 2017-05-11 DIAGNOSIS — Z952 Presence of prosthetic heart valve: Secondary | ICD-10-CM | POA: Diagnosis not present

## 2017-05-11 DIAGNOSIS — K2960 Other gastritis without bleeding: Secondary | ICD-10-CM | POA: Diagnosis not present

## 2017-05-11 DIAGNOSIS — N189 Chronic kidney disease, unspecified: Secondary | ICD-10-CM | POA: Diagnosis not present

## 2017-05-11 DIAGNOSIS — K921 Melena: Secondary | ICD-10-CM | POA: Diagnosis not present

## 2017-05-11 DIAGNOSIS — E875 Hyperkalemia: Secondary | ICD-10-CM | POA: Diagnosis not present

## 2017-05-11 DIAGNOSIS — Z8719 Personal history of other diseases of the digestive system: Secondary | ICD-10-CM | POA: Diagnosis not present

## 2017-05-11 DIAGNOSIS — K269 Duodenal ulcer, unspecified as acute or chronic, without hemorrhage or perforation: Secondary | ICD-10-CM | POA: Diagnosis not present

## 2017-05-11 DIAGNOSIS — J9611 Chronic respiratory failure with hypoxia: Secondary | ICD-10-CM | POA: Diagnosis not present

## 2017-05-11 DIAGNOSIS — K625 Hemorrhage of anus and rectum: Secondary | ICD-10-CM | POA: Diagnosis not present

## 2017-05-11 DIAGNOSIS — N289 Disorder of kidney and ureter, unspecified: Secondary | ICD-10-CM | POA: Diagnosis not present

## 2017-05-11 DIAGNOSIS — R1013 Epigastric pain: Secondary | ICD-10-CM | POA: Diagnosis not present

## 2017-05-11 DIAGNOSIS — Z7901 Long term (current) use of anticoagulants: Secondary | ICD-10-CM | POA: Diagnosis not present

## 2017-05-11 DIAGNOSIS — J441 Chronic obstructive pulmonary disease with (acute) exacerbation: Secondary | ICD-10-CM | POA: Diagnosis not present

## 2017-05-11 DIAGNOSIS — I2510 Atherosclerotic heart disease of native coronary artery without angina pectoris: Secondary | ICD-10-CM | POA: Diagnosis present

## 2017-05-11 DIAGNOSIS — Z86010 Personal history of colonic polyps: Secondary | ICD-10-CM | POA: Diagnosis not present

## 2017-05-11 DIAGNOSIS — D500 Iron deficiency anemia secondary to blood loss (chronic): Secondary | ICD-10-CM | POA: Diagnosis not present

## 2017-05-11 DIAGNOSIS — I358 Other nonrheumatic aortic valve disorders: Secondary | ICD-10-CM | POA: Diagnosis not present

## 2017-05-11 DIAGNOSIS — N183 Chronic kidney disease, stage 3 (moderate): Secondary | ICD-10-CM | POA: Diagnosis not present

## 2017-05-11 DIAGNOSIS — J9621 Acute and chronic respiratory failure with hypoxia: Secondary | ICD-10-CM | POA: Diagnosis not present

## 2017-05-11 DIAGNOSIS — Z9981 Dependence on supplemental oxygen: Secondary | ICD-10-CM | POA: Diagnosis not present

## 2017-05-11 DIAGNOSIS — K922 Gastrointestinal hemorrhage, unspecified: Secondary | ICD-10-CM | POA: Diagnosis not present

## 2017-05-24 DIAGNOSIS — K219 Gastro-esophageal reflux disease without esophagitis: Secondary | ICD-10-CM | POA: Diagnosis not present

## 2017-05-24 DIAGNOSIS — Z6836 Body mass index (BMI) 36.0-36.9, adult: Secondary | ICD-10-CM | POA: Diagnosis not present

## 2017-05-24 DIAGNOSIS — Z7901 Long term (current) use of anticoagulants: Secondary | ICD-10-CM | POA: Diagnosis not present

## 2017-05-24 DIAGNOSIS — K922 Gastrointestinal hemorrhage, unspecified: Secondary | ICD-10-CM | POA: Diagnosis not present

## 2017-05-24 DIAGNOSIS — Z9181 History of falling: Secondary | ICD-10-CM | POA: Diagnosis not present

## 2017-05-24 DIAGNOSIS — D649 Anemia, unspecified: Secondary | ICD-10-CM | POA: Diagnosis not present

## 2017-05-24 DIAGNOSIS — Z1389 Encounter for screening for other disorder: Secondary | ICD-10-CM | POA: Diagnosis not present

## 2017-06-18 DIAGNOSIS — M810 Age-related osteoporosis without current pathological fracture: Secondary | ICD-10-CM | POA: Diagnosis not present

## 2017-06-19 DIAGNOSIS — R0602 Shortness of breath: Secondary | ICD-10-CM | POA: Diagnosis not present

## 2017-06-19 DIAGNOSIS — J45909 Unspecified asthma, uncomplicated: Secondary | ICD-10-CM | POA: Diagnosis not present

## 2017-06-22 DIAGNOSIS — N183 Chronic kidney disease, stage 3 (moderate): Secondary | ICD-10-CM | POA: Diagnosis not present

## 2017-06-25 DIAGNOSIS — N183 Chronic kidney disease, stage 3 (moderate): Secondary | ICD-10-CM | POA: Diagnosis not present

## 2017-06-25 DIAGNOSIS — Z720 Tobacco use: Secondary | ICD-10-CM | POA: Diagnosis not present

## 2017-06-25 DIAGNOSIS — E669 Obesity, unspecified: Secondary | ICD-10-CM | POA: Diagnosis not present

## 2017-06-26 DIAGNOSIS — J449 Chronic obstructive pulmonary disease, unspecified: Secondary | ICD-10-CM | POA: Diagnosis not present

## 2017-06-26 DIAGNOSIS — Z7901 Long term (current) use of anticoagulants: Secondary | ICD-10-CM | POA: Diagnosis not present

## 2017-06-26 DIAGNOSIS — E669 Obesity, unspecified: Secondary | ICD-10-CM | POA: Diagnosis not present

## 2017-06-26 DIAGNOSIS — Z6837 Body mass index (BMI) 37.0-37.9, adult: Secondary | ICD-10-CM | POA: Diagnosis not present

## 2017-06-26 DIAGNOSIS — N183 Chronic kidney disease, stage 3 (moderate): Secondary | ICD-10-CM | POA: Diagnosis not present

## 2017-06-26 DIAGNOSIS — D649 Anemia, unspecified: Secondary | ICD-10-CM | POA: Diagnosis not present

## 2017-08-02 DIAGNOSIS — Z952 Presence of prosthetic heart valve: Secondary | ICD-10-CM | POA: Diagnosis not present

## 2017-08-02 DIAGNOSIS — K219 Gastro-esophageal reflux disease without esophagitis: Secondary | ICD-10-CM | POA: Diagnosis not present

## 2017-08-02 DIAGNOSIS — Z7901 Long term (current) use of anticoagulants: Secondary | ICD-10-CM | POA: Diagnosis not present

## 2017-08-02 DIAGNOSIS — Z6837 Body mass index (BMI) 37.0-37.9, adult: Secondary | ICD-10-CM | POA: Diagnosis not present

## 2017-08-02 DIAGNOSIS — J449 Chronic obstructive pulmonary disease, unspecified: Secondary | ICD-10-CM | POA: Diagnosis not present

## 2017-08-31 DIAGNOSIS — Z6837 Body mass index (BMI) 37.0-37.9, adult: Secondary | ICD-10-CM | POA: Diagnosis not present

## 2017-08-31 DIAGNOSIS — K219 Gastro-esophageal reflux disease without esophagitis: Secondary | ICD-10-CM | POA: Diagnosis not present

## 2017-08-31 DIAGNOSIS — Z7901 Long term (current) use of anticoagulants: Secondary | ICD-10-CM | POA: Diagnosis not present

## 2017-09-14 DIAGNOSIS — J069 Acute upper respiratory infection, unspecified: Secondary | ICD-10-CM | POA: Diagnosis not present

## 2017-09-14 DIAGNOSIS — J449 Chronic obstructive pulmonary disease, unspecified: Secondary | ICD-10-CM | POA: Diagnosis not present

## 2017-09-14 DIAGNOSIS — E669 Obesity, unspecified: Secondary | ICD-10-CM | POA: Diagnosis not present

## 2017-09-14 DIAGNOSIS — Z6837 Body mass index (BMI) 37.0-37.9, adult: Secondary | ICD-10-CM | POA: Diagnosis not present

## 2017-09-21 DIAGNOSIS — Z6837 Body mass index (BMI) 37.0-37.9, adult: Secondary | ICD-10-CM | POA: Diagnosis not present

## 2017-09-21 DIAGNOSIS — J449 Chronic obstructive pulmonary disease, unspecified: Secondary | ICD-10-CM | POA: Diagnosis not present

## 2017-09-21 DIAGNOSIS — J069 Acute upper respiratory infection, unspecified: Secondary | ICD-10-CM | POA: Diagnosis not present

## 2017-10-03 DIAGNOSIS — Z7901 Long term (current) use of anticoagulants: Secondary | ICD-10-CM | POA: Diagnosis not present

## 2017-10-03 DIAGNOSIS — Z6837 Body mass index (BMI) 37.0-37.9, adult: Secondary | ICD-10-CM | POA: Diagnosis not present

## 2017-10-03 DIAGNOSIS — K219 Gastro-esophageal reflux disease without esophagitis: Secondary | ICD-10-CM | POA: Diagnosis not present

## 2017-10-03 DIAGNOSIS — J449 Chronic obstructive pulmonary disease, unspecified: Secondary | ICD-10-CM | POA: Diagnosis not present

## 2017-10-22 DIAGNOSIS — J45909 Unspecified asthma, uncomplicated: Secondary | ICD-10-CM | POA: Diagnosis not present

## 2017-10-22 DIAGNOSIS — J449 Chronic obstructive pulmonary disease, unspecified: Secondary | ICD-10-CM | POA: Diagnosis not present

## 2017-10-22 DIAGNOSIS — F17200 Nicotine dependence, unspecified, uncomplicated: Secondary | ICD-10-CM | POA: Diagnosis not present

## 2017-10-22 DIAGNOSIS — R0602 Shortness of breath: Secondary | ICD-10-CM | POA: Diagnosis not present

## 2017-10-22 DIAGNOSIS — Z87891 Personal history of nicotine dependence: Secondary | ICD-10-CM | POA: Diagnosis not present

## 2017-10-25 DIAGNOSIS — H2513 Age-related nuclear cataract, bilateral: Secondary | ICD-10-CM | POA: Diagnosis not present

## 2017-10-25 DIAGNOSIS — H02839 Dermatochalasis of unspecified eye, unspecified eyelid: Secondary | ICD-10-CM | POA: Diagnosis not present

## 2017-10-25 DIAGNOSIS — H25013 Cortical age-related cataract, bilateral: Secondary | ICD-10-CM | POA: Diagnosis not present

## 2017-10-25 DIAGNOSIS — H25043 Posterior subcapsular polar age-related cataract, bilateral: Secondary | ICD-10-CM | POA: Diagnosis not present

## 2017-10-25 DIAGNOSIS — H2512 Age-related nuclear cataract, left eye: Secondary | ICD-10-CM | POA: Diagnosis not present

## 2017-10-29 DIAGNOSIS — N183 Chronic kidney disease, stage 3 (moderate): Secondary | ICD-10-CM | POA: Diagnosis not present

## 2017-10-30 DIAGNOSIS — Z720 Tobacco use: Secondary | ICD-10-CM | POA: Diagnosis not present

## 2017-10-30 DIAGNOSIS — E669 Obesity, unspecified: Secondary | ICD-10-CM | POA: Diagnosis not present

## 2017-10-30 DIAGNOSIS — N183 Chronic kidney disease, stage 3 (moderate): Secondary | ICD-10-CM | POA: Diagnosis not present

## 2017-11-09 DIAGNOSIS — Z7901 Long term (current) use of anticoagulants: Secondary | ICD-10-CM | POA: Diagnosis not present

## 2017-11-09 DIAGNOSIS — Z6837 Body mass index (BMI) 37.0-37.9, adult: Secondary | ICD-10-CM | POA: Diagnosis not present

## 2017-11-09 DIAGNOSIS — J449 Chronic obstructive pulmonary disease, unspecified: Secondary | ICD-10-CM | POA: Diagnosis not present

## 2017-11-28 DIAGNOSIS — I509 Heart failure, unspecified: Secondary | ICD-10-CM | POA: Diagnosis not present

## 2017-11-28 DIAGNOSIS — F17200 Nicotine dependence, unspecified, uncomplicated: Secondary | ICD-10-CM | POA: Diagnosis not present

## 2017-11-28 DIAGNOSIS — Z6838 Body mass index (BMI) 38.0-38.9, adult: Secondary | ICD-10-CM | POA: Diagnosis not present

## 2017-11-28 DIAGNOSIS — E785 Hyperlipidemia, unspecified: Secondary | ICD-10-CM | POA: Diagnosis not present

## 2017-11-28 DIAGNOSIS — J45909 Unspecified asthma, uncomplicated: Secondary | ICD-10-CM | POA: Diagnosis not present

## 2017-11-28 DIAGNOSIS — E669 Obesity, unspecified: Secondary | ICD-10-CM | POA: Diagnosis not present

## 2017-11-28 DIAGNOSIS — E7800 Pure hypercholesterolemia, unspecified: Secondary | ICD-10-CM | POA: Diagnosis not present

## 2017-11-28 DIAGNOSIS — Z7902 Long term (current) use of antithrombotics/antiplatelets: Secondary | ICD-10-CM | POA: Diagnosis not present

## 2017-11-28 DIAGNOSIS — H2512 Age-related nuclear cataract, left eye: Secondary | ICD-10-CM | POA: Diagnosis not present

## 2017-11-28 DIAGNOSIS — H25012 Cortical age-related cataract, left eye: Secondary | ICD-10-CM | POA: Diagnosis not present

## 2017-11-28 DIAGNOSIS — Z952 Presence of prosthetic heart valve: Secondary | ICD-10-CM | POA: Diagnosis not present

## 2017-11-28 DIAGNOSIS — J449 Chronic obstructive pulmonary disease, unspecified: Secondary | ICD-10-CM | POA: Diagnosis not present

## 2017-11-28 DIAGNOSIS — I110 Hypertensive heart disease with heart failure: Secondary | ICD-10-CM | POA: Diagnosis not present

## 2017-12-12 DIAGNOSIS — Z79899 Other long term (current) drug therapy: Secondary | ICD-10-CM | POA: Diagnosis not present

## 2017-12-12 DIAGNOSIS — E782 Mixed hyperlipidemia: Secondary | ICD-10-CM | POA: Diagnosis not present

## 2017-12-12 DIAGNOSIS — Z7251 High risk heterosexual behavior: Secondary | ICD-10-CM | POA: Diagnosis not present

## 2017-12-12 DIAGNOSIS — J449 Chronic obstructive pulmonary disease, unspecified: Secondary | ICD-10-CM | POA: Diagnosis not present

## 2017-12-12 DIAGNOSIS — E559 Vitamin D deficiency, unspecified: Secondary | ICD-10-CM | POA: Diagnosis not present

## 2017-12-12 DIAGNOSIS — Z7901 Long term (current) use of anticoagulants: Secondary | ICD-10-CM | POA: Diagnosis not present

## 2017-12-12 DIAGNOSIS — Z6837 Body mass index (BMI) 37.0-37.9, adult: Secondary | ICD-10-CM | POA: Diagnosis not present

## 2018-01-15 DIAGNOSIS — J449 Chronic obstructive pulmonary disease, unspecified: Secondary | ICD-10-CM | POA: Diagnosis not present

## 2018-01-15 DIAGNOSIS — N183 Chronic kidney disease, stage 3 (moderate): Secondary | ICD-10-CM | POA: Diagnosis not present

## 2018-01-15 DIAGNOSIS — Z7901 Long term (current) use of anticoagulants: Secondary | ICD-10-CM | POA: Diagnosis not present

## 2018-01-15 DIAGNOSIS — Z6837 Body mass index (BMI) 37.0-37.9, adult: Secondary | ICD-10-CM | POA: Diagnosis not present

## 2018-02-15 DIAGNOSIS — Z79899 Other long term (current) drug therapy: Secondary | ICD-10-CM | POA: Diagnosis not present

## 2018-02-15 DIAGNOSIS — Z7901 Long term (current) use of anticoagulants: Secondary | ICD-10-CM | POA: Diagnosis not present

## 2018-02-15 DIAGNOSIS — R600 Localized edema: Secondary | ICD-10-CM | POA: Diagnosis not present

## 2018-02-15 DIAGNOSIS — I5042 Chronic combined systolic (congestive) and diastolic (congestive) heart failure: Secondary | ICD-10-CM | POA: Diagnosis not present

## 2018-02-15 DIAGNOSIS — J449 Chronic obstructive pulmonary disease, unspecified: Secondary | ICD-10-CM | POA: Diagnosis not present

## 2018-02-19 DIAGNOSIS — R0602 Shortness of breath: Secondary | ICD-10-CM | POA: Diagnosis not present

## 2018-02-19 DIAGNOSIS — J45998 Other asthma: Secondary | ICD-10-CM | POA: Diagnosis not present

## 2018-02-19 DIAGNOSIS — R002 Palpitations: Secondary | ICD-10-CM | POA: Diagnosis not present

## 2018-03-04 DIAGNOSIS — N183 Chronic kidney disease, stage 3 (moderate): Secondary | ICD-10-CM | POA: Diagnosis not present

## 2018-03-05 DIAGNOSIS — E669 Obesity, unspecified: Secondary | ICD-10-CM | POA: Diagnosis not present

## 2018-03-05 DIAGNOSIS — Z720 Tobacco use: Secondary | ICD-10-CM | POA: Diagnosis not present

## 2018-03-05 DIAGNOSIS — Z6837 Body mass index (BMI) 37.0-37.9, adult: Secondary | ICD-10-CM | POA: Diagnosis not present

## 2018-03-05 DIAGNOSIS — M7989 Other specified soft tissue disorders: Secondary | ICD-10-CM | POA: Diagnosis not present

## 2018-03-05 DIAGNOSIS — N183 Chronic kidney disease, stage 3 (moderate): Secondary | ICD-10-CM | POA: Diagnosis not present

## 2018-03-07 DIAGNOSIS — Z79899 Other long term (current) drug therapy: Secondary | ICD-10-CM | POA: Diagnosis not present

## 2018-03-07 DIAGNOSIS — J449 Chronic obstructive pulmonary disease, unspecified: Secondary | ICD-10-CM | POA: Diagnosis not present

## 2018-03-07 DIAGNOSIS — L03116 Cellulitis of left lower limb: Secondary | ICD-10-CM | POA: Diagnosis not present

## 2018-03-07 DIAGNOSIS — N184 Chronic kidney disease, stage 4 (severe): Secondary | ICD-10-CM | POA: Diagnosis not present

## 2018-03-07 DIAGNOSIS — J9610 Chronic respiratory failure, unspecified whether with hypoxia or hypercapnia: Secondary | ICD-10-CM | POA: Diagnosis not present

## 2018-03-07 DIAGNOSIS — Z9114 Patient's other noncompliance with medication regimen: Secondary | ICD-10-CM | POA: Diagnosis not present

## 2018-03-07 DIAGNOSIS — I493 Ventricular premature depolarization: Secondary | ICD-10-CM | POA: Diagnosis not present

## 2018-03-07 DIAGNOSIS — Z716 Tobacco abuse counseling: Secondary | ICD-10-CM | POA: Diagnosis not present

## 2018-03-07 DIAGNOSIS — F17210 Nicotine dependence, cigarettes, uncomplicated: Secondary | ICD-10-CM | POA: Diagnosis not present

## 2018-03-07 DIAGNOSIS — Z96643 Presence of artificial hip joint, bilateral: Secondary | ICD-10-CM | POA: Diagnosis present

## 2018-03-07 DIAGNOSIS — Z952 Presence of prosthetic heart valve: Secondary | ICD-10-CM | POA: Diagnosis not present

## 2018-03-07 DIAGNOSIS — Z96649 Presence of unspecified artificial hip joint: Secondary | ICD-10-CM | POA: Diagnosis not present

## 2018-03-07 DIAGNOSIS — N183 Chronic kidney disease, stage 3 (moderate): Secondary | ICD-10-CM | POA: Diagnosis not present

## 2018-03-07 DIAGNOSIS — L089 Local infection of the skin and subcutaneous tissue, unspecified: Secondary | ICD-10-CM | POA: Diagnosis not present

## 2018-03-07 DIAGNOSIS — I509 Heart failure, unspecified: Secondary | ICD-10-CM | POA: Diagnosis not present

## 2018-03-07 DIAGNOSIS — I2510 Atherosclerotic heart disease of native coronary artery without angina pectoris: Secondary | ICD-10-CM | POA: Diagnosis present

## 2018-03-07 DIAGNOSIS — Z9981 Dependence on supplemental oxygen: Secondary | ICD-10-CM | POA: Diagnosis not present

## 2018-03-07 DIAGNOSIS — L02416 Cutaneous abscess of left lower limb: Secondary | ICD-10-CM | POA: Diagnosis not present

## 2018-03-07 DIAGNOSIS — M79605 Pain in left leg: Secondary | ICD-10-CM | POA: Diagnosis not present

## 2018-03-07 DIAGNOSIS — I420 Dilated cardiomyopathy: Secondary | ICD-10-CM | POA: Diagnosis not present

## 2018-03-07 DIAGNOSIS — I130 Hypertensive heart and chronic kidney disease with heart failure and stage 1 through stage 4 chronic kidney disease, or unspecified chronic kidney disease: Secondary | ICD-10-CM | POA: Diagnosis not present

## 2018-03-07 DIAGNOSIS — R600 Localized edema: Secondary | ICD-10-CM | POA: Diagnosis not present

## 2018-03-07 DIAGNOSIS — L03115 Cellulitis of right lower limb: Secondary | ICD-10-CM | POA: Diagnosis not present

## 2018-03-07 DIAGNOSIS — I129 Hypertensive chronic kidney disease with stage 1 through stage 4 chronic kidney disease, or unspecified chronic kidney disease: Secondary | ICD-10-CM | POA: Diagnosis not present

## 2018-03-07 DIAGNOSIS — F17200 Nicotine dependence, unspecified, uncomplicated: Secondary | ICD-10-CM | POA: Diagnosis not present

## 2018-03-07 DIAGNOSIS — R0602 Shortness of breath: Secondary | ICD-10-CM | POA: Diagnosis not present

## 2018-03-07 DIAGNOSIS — B9562 Methicillin resistant Staphylococcus aureus infection as the cause of diseases classified elsewhere: Secondary | ICD-10-CM | POA: Diagnosis not present

## 2018-03-07 DIAGNOSIS — Z7901 Long term (current) use of anticoagulants: Secondary | ICD-10-CM | POA: Diagnosis not present

## 2018-03-07 DIAGNOSIS — Z9119 Patient's noncompliance with other medical treatment and regimen: Secondary | ICD-10-CM | POA: Diagnosis not present

## 2018-03-07 DIAGNOSIS — I701 Atherosclerosis of renal artery: Secondary | ICD-10-CM | POA: Diagnosis not present

## 2018-03-15 DIAGNOSIS — Z9181 History of falling: Secondary | ICD-10-CM | POA: Diagnosis not present

## 2018-03-15 DIAGNOSIS — Z1331 Encounter for screening for depression: Secondary | ICD-10-CM | POA: Diagnosis not present

## 2018-03-15 DIAGNOSIS — Z1211 Encounter for screening for malignant neoplasm of colon: Secondary | ICD-10-CM | POA: Diagnosis not present

## 2018-03-15 DIAGNOSIS — Z6836 Body mass index (BMI) 36.0-36.9, adult: Secondary | ICD-10-CM | POA: Diagnosis not present

## 2018-03-15 DIAGNOSIS — Z125 Encounter for screening for malignant neoplasm of prostate: Secondary | ICD-10-CM | POA: Diagnosis not present

## 2018-03-15 DIAGNOSIS — E785 Hyperlipidemia, unspecified: Secondary | ICD-10-CM | POA: Diagnosis not present

## 2018-03-15 DIAGNOSIS — Z0000 Encounter for general adult medical examination without abnormal findings: Secondary | ICD-10-CM | POA: Diagnosis not present

## 2018-03-25 DIAGNOSIS — L0390 Cellulitis, unspecified: Secondary | ICD-10-CM | POA: Diagnosis not present

## 2018-03-25 DIAGNOSIS — Z7901 Long term (current) use of anticoagulants: Secondary | ICD-10-CM | POA: Diagnosis not present

## 2018-03-25 DIAGNOSIS — R600 Localized edema: Secondary | ICD-10-CM | POA: Diagnosis not present

## 2018-03-25 DIAGNOSIS — Z6837 Body mass index (BMI) 37.0-37.9, adult: Secondary | ICD-10-CM | POA: Diagnosis not present

## 2018-04-29 DIAGNOSIS — I5042 Chronic combined systolic (congestive) and diastolic (congestive) heart failure: Secondary | ICD-10-CM | POA: Diagnosis not present

## 2018-04-29 DIAGNOSIS — J449 Chronic obstructive pulmonary disease, unspecified: Secondary | ICD-10-CM | POA: Diagnosis not present

## 2018-04-29 DIAGNOSIS — E669 Obesity, unspecified: Secondary | ICD-10-CM | POA: Diagnosis not present

## 2018-04-29 DIAGNOSIS — Z7901 Long term (current) use of anticoagulants: Secondary | ICD-10-CM | POA: Diagnosis not present

## 2018-05-30 DIAGNOSIS — Z6838 Body mass index (BMI) 38.0-38.9, adult: Secondary | ICD-10-CM | POA: Diagnosis not present

## 2018-05-30 DIAGNOSIS — J441 Chronic obstructive pulmonary disease with (acute) exacerbation: Secondary | ICD-10-CM | POA: Diagnosis not present

## 2018-06-03 DIAGNOSIS — Z7901 Long term (current) use of anticoagulants: Secondary | ICD-10-CM | POA: Diagnosis not present

## 2018-06-03 DIAGNOSIS — E782 Mixed hyperlipidemia: Secondary | ICD-10-CM | POA: Diagnosis not present

## 2018-06-03 DIAGNOSIS — E559 Vitamin D deficiency, unspecified: Secondary | ICD-10-CM | POA: Diagnosis not present

## 2018-06-03 DIAGNOSIS — J441 Chronic obstructive pulmonary disease with (acute) exacerbation: Secondary | ICD-10-CM | POA: Diagnosis not present

## 2018-06-04 DIAGNOSIS — J449 Chronic obstructive pulmonary disease, unspecified: Secondary | ICD-10-CM | POA: Diagnosis not present

## 2018-06-04 DIAGNOSIS — E872 Acidosis: Secondary | ICD-10-CM | POA: Diagnosis not present

## 2018-06-04 DIAGNOSIS — E669 Obesity, unspecified: Secondary | ICD-10-CM | POA: Diagnosis not present

## 2018-06-04 DIAGNOSIS — J181 Lobar pneumonia, unspecified organism: Secondary | ICD-10-CM | POA: Diagnosis not present

## 2018-06-04 DIAGNOSIS — F17210 Nicotine dependence, cigarettes, uncomplicated: Secondary | ICD-10-CM | POA: Diagnosis present

## 2018-06-04 DIAGNOSIS — J9622 Acute and chronic respiratory failure with hypercapnia: Secondary | ICD-10-CM | POA: Diagnosis not present

## 2018-06-04 DIAGNOSIS — J9811 Atelectasis: Secondary | ICD-10-CM | POA: Diagnosis not present

## 2018-06-04 DIAGNOSIS — R0602 Shortness of breath: Secondary | ICD-10-CM | POA: Diagnosis not present

## 2018-06-04 DIAGNOSIS — Z9981 Dependence on supplemental oxygen: Secondary | ICD-10-CM | POA: Diagnosis not present

## 2018-06-04 DIAGNOSIS — Z7951 Long term (current) use of inhaled steroids: Secondary | ICD-10-CM | POA: Diagnosis not present

## 2018-06-04 DIAGNOSIS — J9600 Acute respiratory failure, unspecified whether with hypoxia or hypercapnia: Secondary | ICD-10-CM | POA: Diagnosis not present

## 2018-06-04 DIAGNOSIS — J9801 Acute bronchospasm: Secondary | ICD-10-CM | POA: Diagnosis not present

## 2018-06-04 DIAGNOSIS — I5022 Chronic systolic (congestive) heart failure: Secondary | ICD-10-CM | POA: Diagnosis not present

## 2018-06-04 DIAGNOSIS — E785 Hyperlipidemia, unspecified: Secondary | ICD-10-CM | POA: Diagnosis not present

## 2018-06-04 DIAGNOSIS — Z6836 Body mass index (BMI) 36.0-36.9, adult: Secondary | ICD-10-CM | POA: Diagnosis not present

## 2018-06-04 DIAGNOSIS — Z9119 Patient's noncompliance with other medical treatment and regimen: Secondary | ICD-10-CM | POA: Diagnosis not present

## 2018-06-04 DIAGNOSIS — G4733 Obstructive sleep apnea (adult) (pediatric): Secondary | ICD-10-CM | POA: Diagnosis present

## 2018-06-04 DIAGNOSIS — J9621 Acute and chronic respiratory failure with hypoxia: Secondary | ICD-10-CM | POA: Diagnosis not present

## 2018-06-04 DIAGNOSIS — J441 Chronic obstructive pulmonary disease with (acute) exacerbation: Secondary | ICD-10-CM | POA: Diagnosis not present

## 2018-06-04 DIAGNOSIS — Z720 Tobacco use: Secondary | ICD-10-CM | POA: Diagnosis not present

## 2018-06-04 DIAGNOSIS — Z952 Presence of prosthetic heart valve: Secondary | ICD-10-CM | POA: Diagnosis not present

## 2018-06-04 DIAGNOSIS — I2510 Atherosclerotic heart disease of native coronary artery without angina pectoris: Secondary | ICD-10-CM | POA: Diagnosis not present

## 2018-06-04 DIAGNOSIS — Z7901 Long term (current) use of anticoagulants: Secondary | ICD-10-CM | POA: Diagnosis not present

## 2018-06-04 DIAGNOSIS — N183 Chronic kidney disease, stage 3 (moderate): Secondary | ICD-10-CM | POA: Diagnosis not present

## 2018-06-11 DIAGNOSIS — N183 Chronic kidney disease, stage 3 (moderate): Secondary | ICD-10-CM | POA: Diagnosis not present

## 2018-06-12 DIAGNOSIS — J9612 Chronic respiratory failure with hypercapnia: Secondary | ICD-10-CM | POA: Diagnosis not present

## 2018-06-12 DIAGNOSIS — J9611 Chronic respiratory failure with hypoxia: Secondary | ICD-10-CM | POA: Diagnosis not present

## 2018-06-12 DIAGNOSIS — N183 Chronic kidney disease, stage 3 (moderate): Secondary | ICD-10-CM | POA: Diagnosis not present

## 2018-06-17 DIAGNOSIS — J45998 Other asthma: Secondary | ICD-10-CM | POA: Diagnosis not present

## 2018-06-17 DIAGNOSIS — R0602 Shortness of breath: Secondary | ICD-10-CM | POA: Diagnosis not present

## 2018-06-17 DIAGNOSIS — G4710 Hypersomnia, unspecified: Secondary | ICD-10-CM | POA: Diagnosis not present

## 2018-06-17 DIAGNOSIS — J449 Chronic obstructive pulmonary disease, unspecified: Secondary | ICD-10-CM | POA: Diagnosis not present

## 2018-07-04 DIAGNOSIS — J449 Chronic obstructive pulmonary disease, unspecified: Secondary | ICD-10-CM | POA: Diagnosis not present

## 2018-07-04 DIAGNOSIS — Z79899 Other long term (current) drug therapy: Secondary | ICD-10-CM | POA: Diagnosis not present

## 2018-07-04 DIAGNOSIS — E559 Vitamin D deficiency, unspecified: Secondary | ICD-10-CM | POA: Diagnosis not present

## 2018-07-04 DIAGNOSIS — E782 Mixed hyperlipidemia: Secondary | ICD-10-CM | POA: Diagnosis not present

## 2018-07-04 DIAGNOSIS — Z7901 Long term (current) use of anticoagulants: Secondary | ICD-10-CM | POA: Diagnosis not present

## 2018-07-09 DIAGNOSIS — G4710 Hypersomnia, unspecified: Secondary | ICD-10-CM | POA: Diagnosis not present

## 2018-07-23 DIAGNOSIS — G4733 Obstructive sleep apnea (adult) (pediatric): Secondary | ICD-10-CM | POA: Diagnosis not present

## 2018-07-23 DIAGNOSIS — R0602 Shortness of breath: Secondary | ICD-10-CM | POA: Diagnosis not present

## 2018-07-23 DIAGNOSIS — J449 Chronic obstructive pulmonary disease, unspecified: Secondary | ICD-10-CM | POA: Diagnosis not present

## 2018-07-23 DIAGNOSIS — J45998 Other asthma: Secondary | ICD-10-CM | POA: Diagnosis not present

## 2018-08-07 DIAGNOSIS — E782 Mixed hyperlipidemia: Secondary | ICD-10-CM | POA: Diagnosis not present

## 2018-08-07 DIAGNOSIS — E559 Vitamin D deficiency, unspecified: Secondary | ICD-10-CM | POA: Diagnosis not present

## 2018-08-07 DIAGNOSIS — J449 Chronic obstructive pulmonary disease, unspecified: Secondary | ICD-10-CM | POA: Diagnosis not present

## 2018-08-07 DIAGNOSIS — Z7901 Long term (current) use of anticoagulants: Secondary | ICD-10-CM | POA: Diagnosis not present

## 2018-08-13 DIAGNOSIS — I2510 Atherosclerotic heart disease of native coronary artery without angina pectoris: Secondary | ICD-10-CM | POA: Diagnosis present

## 2018-08-13 DIAGNOSIS — E785 Hyperlipidemia, unspecified: Secondary | ICD-10-CM | POA: Diagnosis not present

## 2018-08-13 DIAGNOSIS — Z724 Inappropriate diet and eating habits: Secondary | ICD-10-CM | POA: Diagnosis not present

## 2018-08-13 DIAGNOSIS — R05 Cough: Secondary | ICD-10-CM | POA: Diagnosis not present

## 2018-08-13 DIAGNOSIS — Z7901 Long term (current) use of anticoagulants: Secondary | ICD-10-CM | POA: Diagnosis not present

## 2018-08-13 DIAGNOSIS — G4730 Sleep apnea, unspecified: Secondary | ICD-10-CM | POA: Diagnosis not present

## 2018-08-13 DIAGNOSIS — Z9114 Patient's other noncompliance with medication regimen: Secondary | ICD-10-CM | POA: Diagnosis not present

## 2018-08-13 DIAGNOSIS — N183 Chronic kidney disease, stage 3 (moderate): Secondary | ICD-10-CM | POA: Diagnosis not present

## 2018-08-13 DIAGNOSIS — R079 Chest pain, unspecified: Secondary | ICD-10-CM | POA: Diagnosis not present

## 2018-08-13 DIAGNOSIS — Z6830 Body mass index (BMI) 30.0-30.9, adult: Secondary | ICD-10-CM | POA: Diagnosis not present

## 2018-08-13 DIAGNOSIS — I429 Cardiomyopathy, unspecified: Secondary | ICD-10-CM | POA: Diagnosis not present

## 2018-08-13 DIAGNOSIS — K219 Gastro-esophageal reflux disease without esophagitis: Secondary | ICD-10-CM | POA: Diagnosis not present

## 2018-08-13 DIAGNOSIS — I509 Heart failure, unspecified: Secondary | ICD-10-CM | POA: Diagnosis not present

## 2018-08-13 DIAGNOSIS — E669 Obesity, unspecified: Secondary | ICD-10-CM | POA: Diagnosis not present

## 2018-08-13 DIAGNOSIS — F17210 Nicotine dependence, cigarettes, uncomplicated: Secondary | ICD-10-CM | POA: Diagnosis present

## 2018-08-13 DIAGNOSIS — G4733 Obstructive sleep apnea (adult) (pediatric): Secondary | ICD-10-CM | POA: Diagnosis present

## 2018-08-13 DIAGNOSIS — Z952 Presence of prosthetic heart valve: Secondary | ICD-10-CM | POA: Diagnosis not present

## 2018-08-13 DIAGNOSIS — Z720 Tobacco use: Secondary | ICD-10-CM | POA: Diagnosis not present

## 2018-08-13 DIAGNOSIS — I110 Hypertensive heart disease with heart failure: Secondary | ICD-10-CM | POA: Diagnosis not present

## 2018-08-13 DIAGNOSIS — Z9119 Patient's noncompliance with other medical treatment and regimen: Secondary | ICD-10-CM | POA: Diagnosis not present

## 2018-08-13 DIAGNOSIS — R7989 Other specified abnormal findings of blood chemistry: Secondary | ICD-10-CM | POA: Diagnosis not present

## 2018-08-13 DIAGNOSIS — R0602 Shortness of breath: Secondary | ICD-10-CM | POA: Diagnosis not present

## 2018-08-13 DIAGNOSIS — J441 Chronic obstructive pulmonary disease with (acute) exacerbation: Secondary | ICD-10-CM | POA: Diagnosis not present

## 2018-08-13 DIAGNOSIS — I5043 Acute on chronic combined systolic (congestive) and diastolic (congestive) heart failure: Secondary | ICD-10-CM | POA: Diagnosis not present

## 2018-08-13 DIAGNOSIS — I5033 Acute on chronic diastolic (congestive) heart failure: Secondary | ICD-10-CM | POA: Diagnosis not present

## 2018-08-15 DIAGNOSIS — K219 Gastro-esophageal reflux disease without esophagitis: Secondary | ICD-10-CM | POA: Diagnosis not present

## 2018-08-15 DIAGNOSIS — J9611 Chronic respiratory failure with hypoxia: Secondary | ICD-10-CM | POA: Diagnosis not present

## 2018-08-15 DIAGNOSIS — J9612 Chronic respiratory failure with hypercapnia: Secondary | ICD-10-CM | POA: Diagnosis not present

## 2018-08-15 DIAGNOSIS — Z952 Presence of prosthetic heart valve: Secondary | ICD-10-CM | POA: Diagnosis not present

## 2018-08-15 DIAGNOSIS — J441 Chronic obstructive pulmonary disease with (acute) exacerbation: Secondary | ICD-10-CM | POA: Diagnosis not present

## 2018-08-15 DIAGNOSIS — Z9181 History of falling: Secondary | ICD-10-CM | POA: Diagnosis not present

## 2018-08-15 DIAGNOSIS — N183 Chronic kidney disease, stage 3 (moderate): Secondary | ICD-10-CM | POA: Diagnosis not present

## 2018-08-15 DIAGNOSIS — E785 Hyperlipidemia, unspecified: Secondary | ICD-10-CM | POA: Diagnosis not present

## 2018-08-15 DIAGNOSIS — I2510 Atherosclerotic heart disease of native coronary artery without angina pectoris: Secondary | ICD-10-CM | POA: Diagnosis not present

## 2018-08-15 DIAGNOSIS — I5041 Acute combined systolic (congestive) and diastolic (congestive) heart failure: Secondary | ICD-10-CM | POA: Diagnosis not present

## 2018-08-15 DIAGNOSIS — Z7901 Long term (current) use of anticoagulants: Secondary | ICD-10-CM | POA: Diagnosis not present

## 2018-08-15 DIAGNOSIS — E669 Obesity, unspecified: Secondary | ICD-10-CM | POA: Diagnosis not present

## 2018-08-15 DIAGNOSIS — F17210 Nicotine dependence, cigarettes, uncomplicated: Secondary | ICD-10-CM | POA: Diagnosis not present

## 2018-08-15 DIAGNOSIS — I429 Cardiomyopathy, unspecified: Secondary | ICD-10-CM | POA: Diagnosis not present

## 2018-08-19 DIAGNOSIS — J449 Chronic obstructive pulmonary disease, unspecified: Secondary | ICD-10-CM | POA: Diagnosis not present

## 2018-08-19 DIAGNOSIS — I5042 Chronic combined systolic (congestive) and diastolic (congestive) heart failure: Secondary | ICD-10-CM | POA: Diagnosis not present

## 2018-08-19 DIAGNOSIS — G4730 Sleep apnea, unspecified: Secondary | ICD-10-CM | POA: Diagnosis not present

## 2018-08-19 DIAGNOSIS — Z79899 Other long term (current) drug therapy: Secondary | ICD-10-CM | POA: Diagnosis not present

## 2018-08-22 DIAGNOSIS — I5041 Acute combined systolic (congestive) and diastolic (congestive) heart failure: Secondary | ICD-10-CM | POA: Diagnosis not present

## 2018-08-22 DIAGNOSIS — J9611 Chronic respiratory failure with hypoxia: Secondary | ICD-10-CM | POA: Diagnosis not present

## 2018-08-22 DIAGNOSIS — N183 Chronic kidney disease, stage 3 (moderate): Secondary | ICD-10-CM | POA: Diagnosis not present

## 2018-08-22 DIAGNOSIS — J441 Chronic obstructive pulmonary disease with (acute) exacerbation: Secondary | ICD-10-CM | POA: Diagnosis not present

## 2018-08-22 DIAGNOSIS — J9612 Chronic respiratory failure with hypercapnia: Secondary | ICD-10-CM | POA: Diagnosis not present

## 2018-08-22 DIAGNOSIS — I2510 Atherosclerotic heart disease of native coronary artery without angina pectoris: Secondary | ICD-10-CM | POA: Diagnosis not present

## 2018-09-02 DIAGNOSIS — N183 Chronic kidney disease, stage 3 (moderate): Secondary | ICD-10-CM | POA: Diagnosis not present

## 2018-09-02 DIAGNOSIS — J9612 Chronic respiratory failure with hypercapnia: Secondary | ICD-10-CM | POA: Diagnosis not present

## 2018-09-02 DIAGNOSIS — I5041 Acute combined systolic (congestive) and diastolic (congestive) heart failure: Secondary | ICD-10-CM | POA: Diagnosis not present

## 2018-09-02 DIAGNOSIS — I2510 Atherosclerotic heart disease of native coronary artery without angina pectoris: Secondary | ICD-10-CM | POA: Diagnosis not present

## 2018-09-02 DIAGNOSIS — J441 Chronic obstructive pulmonary disease with (acute) exacerbation: Secondary | ICD-10-CM | POA: Diagnosis not present

## 2018-09-02 DIAGNOSIS — J9611 Chronic respiratory failure with hypoxia: Secondary | ICD-10-CM | POA: Diagnosis not present

## 2018-09-06 DIAGNOSIS — I5041 Acute combined systolic (congestive) and diastolic (congestive) heart failure: Secondary | ICD-10-CM | POA: Diagnosis not present

## 2018-09-06 DIAGNOSIS — N183 Chronic kidney disease, stage 3 (moderate): Secondary | ICD-10-CM | POA: Diagnosis not present

## 2018-09-06 DIAGNOSIS — I2510 Atherosclerotic heart disease of native coronary artery without angina pectoris: Secondary | ICD-10-CM | POA: Diagnosis not present

## 2018-09-06 DIAGNOSIS — J9612 Chronic respiratory failure with hypercapnia: Secondary | ICD-10-CM | POA: Diagnosis not present

## 2018-09-06 DIAGNOSIS — J9611 Chronic respiratory failure with hypoxia: Secondary | ICD-10-CM | POA: Diagnosis not present

## 2018-09-06 DIAGNOSIS — J441 Chronic obstructive pulmonary disease with (acute) exacerbation: Secondary | ICD-10-CM | POA: Diagnosis not present

## 2018-09-09 DIAGNOSIS — J441 Chronic obstructive pulmonary disease with (acute) exacerbation: Secondary | ICD-10-CM | POA: Diagnosis not present

## 2018-09-09 DIAGNOSIS — I5041 Acute combined systolic (congestive) and diastolic (congestive) heart failure: Secondary | ICD-10-CM | POA: Diagnosis not present

## 2018-09-09 DIAGNOSIS — N183 Chronic kidney disease, stage 3 (moderate): Secondary | ICD-10-CM | POA: Diagnosis not present

## 2018-09-09 DIAGNOSIS — J9612 Chronic respiratory failure with hypercapnia: Secondary | ICD-10-CM | POA: Diagnosis not present

## 2018-09-09 DIAGNOSIS — J9611 Chronic respiratory failure with hypoxia: Secondary | ICD-10-CM | POA: Diagnosis not present

## 2018-09-09 DIAGNOSIS — I2510 Atherosclerotic heart disease of native coronary artery without angina pectoris: Secondary | ICD-10-CM | POA: Diagnosis not present

## 2018-09-10 DIAGNOSIS — G4733 Obstructive sleep apnea (adult) (pediatric): Secondary | ICD-10-CM | POA: Diagnosis not present

## 2018-09-16 DIAGNOSIS — N183 Chronic kidney disease, stage 3 (moderate): Secondary | ICD-10-CM | POA: Diagnosis not present

## 2018-09-16 DIAGNOSIS — I5041 Acute combined systolic (congestive) and diastolic (congestive) heart failure: Secondary | ICD-10-CM | POA: Diagnosis not present

## 2018-09-16 DIAGNOSIS — J441 Chronic obstructive pulmonary disease with (acute) exacerbation: Secondary | ICD-10-CM | POA: Diagnosis not present

## 2018-09-16 DIAGNOSIS — J9612 Chronic respiratory failure with hypercapnia: Secondary | ICD-10-CM | POA: Diagnosis not present

## 2018-09-16 DIAGNOSIS — I2510 Atherosclerotic heart disease of native coronary artery without angina pectoris: Secondary | ICD-10-CM | POA: Diagnosis not present

## 2018-09-16 DIAGNOSIS — Z6838 Body mass index (BMI) 38.0-38.9, adult: Secondary | ICD-10-CM | POA: Diagnosis not present

## 2018-09-16 DIAGNOSIS — J449 Chronic obstructive pulmonary disease, unspecified: Secondary | ICD-10-CM | POA: Diagnosis not present

## 2018-09-16 DIAGNOSIS — I5042 Chronic combined systolic (congestive) and diastolic (congestive) heart failure: Secondary | ICD-10-CM | POA: Diagnosis not present

## 2018-09-16 DIAGNOSIS — Z7901 Long term (current) use of anticoagulants: Secondary | ICD-10-CM | POA: Diagnosis not present

## 2018-09-16 DIAGNOSIS — J9611 Chronic respiratory failure with hypoxia: Secondary | ICD-10-CM | POA: Diagnosis not present

## 2018-09-23 DIAGNOSIS — L570 Actinic keratosis: Secondary | ICD-10-CM | POA: Diagnosis not present

## 2018-09-23 DIAGNOSIS — L853 Xerosis cutis: Secondary | ICD-10-CM | POA: Diagnosis not present

## 2018-09-23 DIAGNOSIS — L821 Other seborrheic keratosis: Secondary | ICD-10-CM | POA: Diagnosis not present

## 2018-09-30 DIAGNOSIS — R0602 Shortness of breath: Secondary | ICD-10-CM | POA: Diagnosis not present

## 2018-09-30 DIAGNOSIS — G4733 Obstructive sleep apnea (adult) (pediatric): Secondary | ICD-10-CM | POA: Diagnosis not present

## 2018-09-30 DIAGNOSIS — J449 Chronic obstructive pulmonary disease, unspecified: Secondary | ICD-10-CM | POA: Diagnosis not present

## 2018-10-15 DIAGNOSIS — N183 Chronic kidney disease, stage 3 (moderate): Secondary | ICD-10-CM | POA: Diagnosis not present

## 2018-10-16 DIAGNOSIS — N183 Chronic kidney disease, stage 3 (moderate): Secondary | ICD-10-CM | POA: Diagnosis not present

## 2018-10-16 DIAGNOSIS — E669 Obesity, unspecified: Secondary | ICD-10-CM | POA: Diagnosis not present

## 2018-10-16 DIAGNOSIS — Z720 Tobacco use: Secondary | ICD-10-CM | POA: Diagnosis not present

## 2018-10-22 DIAGNOSIS — J9611 Chronic respiratory failure with hypoxia: Secondary | ICD-10-CM | POA: Diagnosis not present

## 2018-10-22 DIAGNOSIS — J449 Chronic obstructive pulmonary disease, unspecified: Secondary | ICD-10-CM | POA: Diagnosis not present

## 2018-10-22 DIAGNOSIS — Z7901 Long term (current) use of anticoagulants: Secondary | ICD-10-CM | POA: Diagnosis not present

## 2018-10-22 DIAGNOSIS — N183 Chronic kidney disease, stage 3 (moderate): Secondary | ICD-10-CM | POA: Diagnosis not present

## 2018-11-14 DIAGNOSIS — G4733 Obstructive sleep apnea (adult) (pediatric): Secondary | ICD-10-CM | POA: Diagnosis not present

## 2018-11-14 DIAGNOSIS — Z6837 Body mass index (BMI) 37.0-37.9, adult: Secondary | ICD-10-CM | POA: Diagnosis not present

## 2018-11-14 DIAGNOSIS — J45909 Unspecified asthma, uncomplicated: Secondary | ICD-10-CM | POA: Diagnosis not present

## 2018-11-14 DIAGNOSIS — J449 Chronic obstructive pulmonary disease, unspecified: Secondary | ICD-10-CM | POA: Diagnosis not present

## 2018-11-19 DIAGNOSIS — J9611 Chronic respiratory failure with hypoxia: Secondary | ICD-10-CM | POA: Diagnosis not present

## 2018-11-19 DIAGNOSIS — J449 Chronic obstructive pulmonary disease, unspecified: Secondary | ICD-10-CM | POA: Diagnosis not present

## 2018-11-19 DIAGNOSIS — J441 Chronic obstructive pulmonary disease with (acute) exacerbation: Secondary | ICD-10-CM | POA: Diagnosis not present

## 2018-11-19 DIAGNOSIS — Z7901 Long term (current) use of anticoagulants: Secondary | ICD-10-CM | POA: Diagnosis not present

## 2019-01-30 DIAGNOSIS — J9611 Chronic respiratory failure with hypoxia: Secondary | ICD-10-CM | POA: Diagnosis not present

## 2019-01-30 DIAGNOSIS — I5042 Chronic combined systolic (congestive) and diastolic (congestive) heart failure: Secondary | ICD-10-CM | POA: Diagnosis not present

## 2019-01-30 DIAGNOSIS — J449 Chronic obstructive pulmonary disease, unspecified: Secondary | ICD-10-CM | POA: Diagnosis not present

## 2019-01-30 DIAGNOSIS — Z7901 Long term (current) use of anticoagulants: Secondary | ICD-10-CM | POA: Diagnosis not present

## 2019-02-19 DIAGNOSIS — N183 Chronic kidney disease, stage 3 (moderate): Secondary | ICD-10-CM | POA: Diagnosis not present

## 2019-02-20 DIAGNOSIS — Z720 Tobacco use: Secondary | ICD-10-CM | POA: Diagnosis not present

## 2019-02-20 DIAGNOSIS — J9612 Chronic respiratory failure with hypercapnia: Secondary | ICD-10-CM | POA: Diagnosis not present

## 2019-02-20 DIAGNOSIS — J9611 Chronic respiratory failure with hypoxia: Secondary | ICD-10-CM | POA: Diagnosis not present

## 2019-02-20 DIAGNOSIS — N183 Chronic kidney disease, stage 3 (moderate): Secondary | ICD-10-CM | POA: Diagnosis not present

## 2019-02-20 DIAGNOSIS — E669 Obesity, unspecified: Secondary | ICD-10-CM | POA: Diagnosis not present

## 2019-03-20 DIAGNOSIS — J449 Chronic obstructive pulmonary disease, unspecified: Secondary | ICD-10-CM | POA: Diagnosis not present

## 2019-03-20 DIAGNOSIS — J9801 Acute bronchospasm: Secondary | ICD-10-CM | POA: Diagnosis not present

## 2019-03-21 DIAGNOSIS — Z0000 Encounter for general adult medical examination without abnormal findings: Secondary | ICD-10-CM | POA: Diagnosis not present

## 2019-03-21 DIAGNOSIS — Z79899 Other long term (current) drug therapy: Secondary | ICD-10-CM | POA: Diagnosis not present

## 2019-03-21 DIAGNOSIS — E785 Hyperlipidemia, unspecified: Secondary | ICD-10-CM | POA: Diagnosis not present

## 2019-03-21 DIAGNOSIS — Z7901 Long term (current) use of anticoagulants: Secondary | ICD-10-CM | POA: Diagnosis not present

## 2019-03-21 DIAGNOSIS — Z6836 Body mass index (BMI) 36.0-36.9, adult: Secondary | ICD-10-CM | POA: Diagnosis not present

## 2019-03-21 DIAGNOSIS — Z9181 History of falling: Secondary | ICD-10-CM | POA: Diagnosis not present

## 2019-03-21 DIAGNOSIS — E782 Mixed hyperlipidemia: Secondary | ICD-10-CM | POA: Diagnosis not present

## 2019-03-21 DIAGNOSIS — E559 Vitamin D deficiency, unspecified: Secondary | ICD-10-CM | POA: Diagnosis not present

## 2019-03-21 DIAGNOSIS — Z1331 Encounter for screening for depression: Secondary | ICD-10-CM | POA: Diagnosis not present

## 2019-03-21 DIAGNOSIS — I5042 Chronic combined systolic (congestive) and diastolic (congestive) heart failure: Secondary | ICD-10-CM | POA: Diagnosis not present

## 2019-04-24 DIAGNOSIS — J9611 Chronic respiratory failure with hypoxia: Secondary | ICD-10-CM | POA: Diagnosis not present

## 2019-04-24 DIAGNOSIS — Z7901 Long term (current) use of anticoagulants: Secondary | ICD-10-CM | POA: Diagnosis not present

## 2019-04-24 DIAGNOSIS — I5042 Chronic combined systolic (congestive) and diastolic (congestive) heart failure: Secondary | ICD-10-CM | POA: Diagnosis not present

## 2019-04-24 DIAGNOSIS — J449 Chronic obstructive pulmonary disease, unspecified: Secondary | ICD-10-CM | POA: Diagnosis not present

## 2019-05-02 DIAGNOSIS — E669 Obesity, unspecified: Secondary | ICD-10-CM | POA: Diagnosis not present

## 2019-05-02 DIAGNOSIS — Z6836 Body mass index (BMI) 36.0-36.9, adult: Secondary | ICD-10-CM | POA: Diagnosis not present

## 2019-05-02 DIAGNOSIS — M79605 Pain in left leg: Secondary | ICD-10-CM | POA: Diagnosis not present

## 2019-05-05 DIAGNOSIS — Z6837 Body mass index (BMI) 37.0-37.9, adult: Secondary | ICD-10-CM | POA: Diagnosis not present

## 2019-05-05 DIAGNOSIS — M79605 Pain in left leg: Secondary | ICD-10-CM | POA: Diagnosis not present

## 2019-05-29 DIAGNOSIS — Z7901 Long term (current) use of anticoagulants: Secondary | ICD-10-CM | POA: Diagnosis not present

## 2019-05-29 DIAGNOSIS — M79605 Pain in left leg: Secondary | ICD-10-CM | POA: Diagnosis not present

## 2019-05-29 DIAGNOSIS — J449 Chronic obstructive pulmonary disease, unspecified: Secondary | ICD-10-CM | POA: Diagnosis not present

## 2019-05-29 DIAGNOSIS — I5042 Chronic combined systolic (congestive) and diastolic (congestive) heart failure: Secondary | ICD-10-CM | POA: Diagnosis not present

## 2019-06-17 DIAGNOSIS — Z96649 Presence of unspecified artificial hip joint: Secondary | ICD-10-CM | POA: Diagnosis not present

## 2019-06-24 DIAGNOSIS — N183 Chronic kidney disease, stage 3 unspecified: Secondary | ICD-10-CM | POA: Diagnosis not present

## 2019-06-25 DIAGNOSIS — J9612 Chronic respiratory failure with hypercapnia: Secondary | ICD-10-CM | POA: Diagnosis not present

## 2019-06-25 DIAGNOSIS — J9611 Chronic respiratory failure with hypoxia: Secondary | ICD-10-CM | POA: Diagnosis not present

## 2019-06-25 DIAGNOSIS — E669 Obesity, unspecified: Secondary | ICD-10-CM | POA: Diagnosis not present

## 2019-06-25 DIAGNOSIS — N1832 Chronic kidney disease, stage 3b: Secondary | ICD-10-CM | POA: Diagnosis not present

## 2019-07-04 DIAGNOSIS — J449 Chronic obstructive pulmonary disease, unspecified: Secondary | ICD-10-CM | POA: Diagnosis not present

## 2019-07-04 DIAGNOSIS — E782 Mixed hyperlipidemia: Secondary | ICD-10-CM | POA: Diagnosis not present

## 2019-07-04 DIAGNOSIS — Z7901 Long term (current) use of anticoagulants: Secondary | ICD-10-CM | POA: Diagnosis not present

## 2019-07-04 DIAGNOSIS — I712 Thoracic aortic aneurysm, without rupture: Secondary | ICD-10-CM | POA: Diagnosis not present

## 2019-07-04 DIAGNOSIS — I5042 Chronic combined systolic (congestive) and diastolic (congestive) heart failure: Secondary | ICD-10-CM | POA: Diagnosis not present

## 2019-07-08 DIAGNOSIS — M948X8 Other specified disorders of cartilage, other site: Secondary | ICD-10-CM | POA: Diagnosis not present

## 2019-07-08 DIAGNOSIS — T84091A Other mechanical complication of internal left hip prosthesis, initial encounter: Secondary | ICD-10-CM | POA: Diagnosis not present

## 2019-07-08 DIAGNOSIS — M8588 Other specified disorders of bone density and structure, other site: Secondary | ICD-10-CM | POA: Diagnosis not present

## 2019-07-08 DIAGNOSIS — M461 Sacroiliitis, not elsewhere classified: Secondary | ICD-10-CM | POA: Diagnosis not present

## 2019-07-08 DIAGNOSIS — T84068A Wear of articular bearing surface of other internal prosthetic joint, initial encounter: Secondary | ICD-10-CM | POA: Diagnosis not present

## 2019-07-08 DIAGNOSIS — M25552 Pain in left hip: Secondary | ICD-10-CM | POA: Diagnosis not present

## 2019-07-08 DIAGNOSIS — Z96643 Presence of artificial hip joint, bilateral: Secondary | ICD-10-CM | POA: Diagnosis not present

## 2019-07-17 DIAGNOSIS — J45909 Unspecified asthma, uncomplicated: Secondary | ICD-10-CM | POA: Diagnosis not present

## 2019-07-17 DIAGNOSIS — J449 Chronic obstructive pulmonary disease, unspecified: Secondary | ICD-10-CM | POA: Diagnosis not present

## 2019-07-17 DIAGNOSIS — R0602 Shortness of breath: Secondary | ICD-10-CM | POA: Diagnosis not present

## 2019-08-01 DIAGNOSIS — Z7901 Long term (current) use of anticoagulants: Secondary | ICD-10-CM | POA: Diagnosis not present

## 2019-08-01 DIAGNOSIS — Z79899 Other long term (current) drug therapy: Secondary | ICD-10-CM | POA: Diagnosis not present

## 2019-08-01 DIAGNOSIS — E559 Vitamin D deficiency, unspecified: Secondary | ICD-10-CM | POA: Diagnosis not present

## 2019-08-01 DIAGNOSIS — E782 Mixed hyperlipidemia: Secondary | ICD-10-CM | POA: Diagnosis not present

## 2019-08-01 DIAGNOSIS — I5042 Chronic combined systolic (congestive) and diastolic (congestive) heart failure: Secondary | ICD-10-CM | POA: Diagnosis not present

## 2019-08-05 DIAGNOSIS — G8929 Other chronic pain: Secondary | ICD-10-CM | POA: Diagnosis not present

## 2019-08-05 DIAGNOSIS — M25551 Pain in right hip: Secondary | ICD-10-CM | POA: Diagnosis not present

## 2019-08-05 DIAGNOSIS — Z471 Aftercare following joint replacement surgery: Secondary | ICD-10-CM | POA: Diagnosis not present

## 2019-08-05 DIAGNOSIS — M25552 Pain in left hip: Secondary | ICD-10-CM | POA: Diagnosis not present

## 2019-08-05 DIAGNOSIS — R799 Abnormal finding of blood chemistry, unspecified: Secondary | ICD-10-CM | POA: Diagnosis not present

## 2019-08-05 DIAGNOSIS — Z96643 Presence of artificial hip joint, bilateral: Secondary | ICD-10-CM | POA: Diagnosis not present

## 2019-08-08 DIAGNOSIS — I5041 Acute combined systolic (congestive) and diastolic (congestive) heart failure: Secondary | ICD-10-CM | POA: Diagnosis not present

## 2019-08-08 DIAGNOSIS — I469 Cardiac arrest, cause unspecified: Secondary | ICD-10-CM | POA: Diagnosis not present

## 2019-08-08 DIAGNOSIS — Z01810 Encounter for preprocedural cardiovascular examination: Secondary | ICD-10-CM | POA: Diagnosis not present

## 2019-08-08 DIAGNOSIS — Z720 Tobacco use: Secondary | ICD-10-CM | POA: Diagnosis not present

## 2019-08-08 DIAGNOSIS — I714 Abdominal aortic aneurysm, without rupture: Secondary | ICD-10-CM | POA: Diagnosis not present

## 2019-08-08 DIAGNOSIS — J441 Chronic obstructive pulmonary disease with (acute) exacerbation: Secondary | ICD-10-CM | POA: Diagnosis not present

## 2019-08-08 DIAGNOSIS — K922 Gastrointestinal hemorrhage, unspecified: Secondary | ICD-10-CM | POA: Diagnosis not present

## 2019-08-08 DIAGNOSIS — J9611 Chronic respiratory failure with hypoxia: Secondary | ICD-10-CM | POA: Diagnosis not present

## 2019-08-08 DIAGNOSIS — Z7901 Long term (current) use of anticoagulants: Secondary | ICD-10-CM | POA: Diagnosis not present

## 2019-08-08 DIAGNOSIS — J9612 Chronic respiratory failure with hypercapnia: Secondary | ICD-10-CM | POA: Diagnosis not present

## 2019-08-08 DIAGNOSIS — N1832 Chronic kidney disease, stage 3b: Secondary | ICD-10-CM | POA: Diagnosis not present

## 2019-08-08 DIAGNOSIS — Z952 Presence of prosthetic heart valve: Secondary | ICD-10-CM | POA: Diagnosis not present

## 2019-08-14 DIAGNOSIS — R918 Other nonspecific abnormal finding of lung field: Secondary | ICD-10-CM | POA: Diagnosis not present

## 2019-08-14 DIAGNOSIS — Z01810 Encounter for preprocedural cardiovascular examination: Secondary | ICD-10-CM | POA: Diagnosis not present

## 2019-09-09 DIAGNOSIS — Z720 Tobacco use: Secondary | ICD-10-CM | POA: Diagnosis not present

## 2019-09-09 DIAGNOSIS — K922 Gastrointestinal hemorrhage, unspecified: Secondary | ICD-10-CM | POA: Diagnosis not present

## 2019-09-09 DIAGNOSIS — Z7901 Long term (current) use of anticoagulants: Secondary | ICD-10-CM | POA: Diagnosis not present

## 2019-09-09 DIAGNOSIS — I469 Cardiac arrest, cause unspecified: Secondary | ICD-10-CM | POA: Diagnosis not present

## 2019-09-09 DIAGNOSIS — I714 Abdominal aortic aneurysm, without rupture: Secondary | ICD-10-CM | POA: Diagnosis not present

## 2019-09-09 DIAGNOSIS — I5041 Acute combined systolic (congestive) and diastolic (congestive) heart failure: Secondary | ICD-10-CM | POA: Diagnosis not present

## 2019-09-09 DIAGNOSIS — Z952 Presence of prosthetic heart valve: Secondary | ICD-10-CM | POA: Diagnosis not present

## 2019-09-30 DIAGNOSIS — I5041 Acute combined systolic (congestive) and diastolic (congestive) heart failure: Secondary | ICD-10-CM | POA: Diagnosis not present

## 2019-10-01 DIAGNOSIS — Z7901 Long term (current) use of anticoagulants: Secondary | ICD-10-CM | POA: Diagnosis not present

## 2019-10-01 DIAGNOSIS — J9611 Chronic respiratory failure with hypoxia: Secondary | ICD-10-CM | POA: Diagnosis not present

## 2019-10-01 DIAGNOSIS — I5042 Chronic combined systolic (congestive) and diastolic (congestive) heart failure: Secondary | ICD-10-CM | POA: Diagnosis not present

## 2019-10-01 DIAGNOSIS — J449 Chronic obstructive pulmonary disease, unspecified: Secondary | ICD-10-CM | POA: Diagnosis not present

## 2019-10-24 DIAGNOSIS — Z720 Tobacco use: Secondary | ICD-10-CM | POA: Diagnosis not present

## 2019-10-24 DIAGNOSIS — J449 Chronic obstructive pulmonary disease, unspecified: Secondary | ICD-10-CM | POA: Diagnosis not present

## 2019-10-24 DIAGNOSIS — I714 Abdominal aortic aneurysm, without rupture: Secondary | ICD-10-CM | POA: Diagnosis not present

## 2019-10-24 DIAGNOSIS — Z79899 Other long term (current) drug therapy: Secondary | ICD-10-CM | POA: Diagnosis not present

## 2019-10-24 DIAGNOSIS — I130 Hypertensive heart and chronic kidney disease with heart failure and stage 1 through stage 4 chronic kidney disease, or unspecified chronic kidney disease: Secondary | ICD-10-CM | POA: Diagnosis not present

## 2019-10-24 DIAGNOSIS — I469 Cardiac arrest, cause unspecified: Secondary | ICD-10-CM | POA: Diagnosis not present

## 2019-10-24 DIAGNOSIS — Z7901 Long term (current) use of anticoagulants: Secondary | ICD-10-CM | POA: Diagnosis not present

## 2019-10-24 DIAGNOSIS — K922 Gastrointestinal hemorrhage, unspecified: Secondary | ICD-10-CM | POA: Diagnosis not present

## 2019-10-24 DIAGNOSIS — J441 Chronic obstructive pulmonary disease with (acute) exacerbation: Secondary | ICD-10-CM | POA: Diagnosis not present

## 2019-10-24 DIAGNOSIS — Z7951 Long term (current) use of inhaled steroids: Secondary | ICD-10-CM | POA: Diagnosis not present

## 2019-10-24 DIAGNOSIS — Z8674 Personal history of sudden cardiac arrest: Secondary | ICD-10-CM | POA: Diagnosis not present

## 2019-10-24 DIAGNOSIS — G4733 Obstructive sleep apnea (adult) (pediatric): Secondary | ICD-10-CM | POA: Diagnosis not present

## 2019-10-24 DIAGNOSIS — I712 Thoracic aortic aneurysm, without rupture: Secondary | ICD-10-CM | POA: Diagnosis not present

## 2019-10-24 DIAGNOSIS — Z8719 Personal history of other diseases of the digestive system: Secondary | ICD-10-CM | POA: Diagnosis not present

## 2019-10-24 DIAGNOSIS — N183 Chronic kidney disease, stage 3 unspecified: Secondary | ICD-10-CM | POA: Diagnosis not present

## 2019-10-24 DIAGNOSIS — N1832 Chronic kidney disease, stage 3b: Secondary | ICD-10-CM | POA: Diagnosis not present

## 2019-10-24 DIAGNOSIS — R9431 Abnormal electrocardiogram [ECG] [EKG]: Secondary | ICD-10-CM | POA: Diagnosis not present

## 2019-10-24 DIAGNOSIS — Z952 Presence of prosthetic heart valve: Secondary | ICD-10-CM | POA: Diagnosis not present

## 2019-10-24 DIAGNOSIS — I5022 Chronic systolic (congestive) heart failure: Secondary | ICD-10-CM | POA: Diagnosis not present

## 2019-10-24 DIAGNOSIS — F17210 Nicotine dependence, cigarettes, uncomplicated: Secondary | ICD-10-CM | POA: Diagnosis not present

## 2019-10-24 DIAGNOSIS — Z9981 Dependence on supplemental oxygen: Secondary | ICD-10-CM | POA: Diagnosis not present

## 2019-10-24 DIAGNOSIS — I5041 Acute combined systolic (congestive) and diastolic (congestive) heart failure: Secondary | ICD-10-CM | POA: Diagnosis not present

## 2019-10-31 DIAGNOSIS — Z7901 Long term (current) use of anticoagulants: Secondary | ICD-10-CM | POA: Diagnosis not present

## 2019-10-31 DIAGNOSIS — Z6837 Body mass index (BMI) 37.0-37.9, adult: Secondary | ICD-10-CM | POA: Diagnosis not present

## 2019-10-31 DIAGNOSIS — I5042 Chronic combined systolic (congestive) and diastolic (congestive) heart failure: Secondary | ICD-10-CM | POA: Diagnosis not present

## 2019-10-31 DIAGNOSIS — J449 Chronic obstructive pulmonary disease, unspecified: Secondary | ICD-10-CM | POA: Diagnosis not present

## 2019-11-06 DIAGNOSIS — J449 Chronic obstructive pulmonary disease, unspecified: Secondary | ICD-10-CM | POA: Diagnosis not present

## 2019-11-27 DIAGNOSIS — E669 Obesity, unspecified: Secondary | ICD-10-CM | POA: Diagnosis not present

## 2019-11-27 DIAGNOSIS — N1832 Chronic kidney disease, stage 3b: Secondary | ICD-10-CM | POA: Diagnosis not present

## 2019-11-27 DIAGNOSIS — Z720 Tobacco use: Secondary | ICD-10-CM | POA: Diagnosis not present

## 2019-11-28 DIAGNOSIS — I5042 Chronic combined systolic (congestive) and diastolic (congestive) heart failure: Secondary | ICD-10-CM | POA: Diagnosis not present

## 2019-11-28 DIAGNOSIS — J449 Chronic obstructive pulmonary disease, unspecified: Secondary | ICD-10-CM | POA: Diagnosis not present

## 2019-11-28 DIAGNOSIS — Z7901 Long term (current) use of anticoagulants: Secondary | ICD-10-CM | POA: Diagnosis not present

## 2019-11-28 DIAGNOSIS — Z6837 Body mass index (BMI) 37.0-37.9, adult: Secondary | ICD-10-CM | POA: Diagnosis not present

## 2019-12-23 DIAGNOSIS — Z7901 Long term (current) use of anticoagulants: Secondary | ICD-10-CM | POA: Diagnosis not present

## 2019-12-23 DIAGNOSIS — J449 Chronic obstructive pulmonary disease, unspecified: Secondary | ICD-10-CM | POA: Diagnosis not present

## 2019-12-23 DIAGNOSIS — I5042 Chronic combined systolic (congestive) and diastolic (congestive) heart failure: Secondary | ICD-10-CM | POA: Diagnosis not present

## 2019-12-23 DIAGNOSIS — Z6837 Body mass index (BMI) 37.0-37.9, adult: Secondary | ICD-10-CM | POA: Diagnosis not present

## 2019-12-24 DIAGNOSIS — K4090 Unilateral inguinal hernia, without obstruction or gangrene, not specified as recurrent: Secondary | ICD-10-CM | POA: Diagnosis not present

## 2019-12-24 DIAGNOSIS — Z96643 Presence of artificial hip joint, bilateral: Secondary | ICD-10-CM | POA: Diagnosis not present

## 2019-12-24 DIAGNOSIS — M25552 Pain in left hip: Secondary | ICD-10-CM | POA: Diagnosis not present

## 2019-12-24 DIAGNOSIS — Z01818 Encounter for other preprocedural examination: Secondary | ICD-10-CM | POA: Diagnosis not present

## 2020-01-23 DIAGNOSIS — E782 Mixed hyperlipidemia: Secondary | ICD-10-CM | POA: Diagnosis not present

## 2020-01-23 DIAGNOSIS — Z79899 Other long term (current) drug therapy: Secondary | ICD-10-CM | POA: Diagnosis not present

## 2020-01-23 DIAGNOSIS — E559 Vitamin D deficiency, unspecified: Secondary | ICD-10-CM | POA: Diagnosis not present

## 2020-01-28 DIAGNOSIS — J441 Chronic obstructive pulmonary disease with (acute) exacerbation: Secondary | ICD-10-CM | POA: Diagnosis not present

## 2020-01-28 DIAGNOSIS — I5041 Acute combined systolic (congestive) and diastolic (congestive) heart failure: Secondary | ICD-10-CM | POA: Diagnosis not present

## 2020-01-28 DIAGNOSIS — Z6830 Body mass index (BMI) 30.0-30.9, adult: Secondary | ICD-10-CM | POA: Diagnosis not present

## 2020-01-28 DIAGNOSIS — N1832 Chronic kidney disease, stage 3b: Secondary | ICD-10-CM | POA: Diagnosis not present

## 2020-01-28 DIAGNOSIS — E669 Obesity, unspecified: Secondary | ICD-10-CM | POA: Diagnosis not present

## 2020-01-28 DIAGNOSIS — I714 Abdominal aortic aneurysm, without rupture: Secondary | ICD-10-CM | POA: Diagnosis not present

## 2020-01-28 DIAGNOSIS — Z952 Presence of prosthetic heart valve: Secondary | ICD-10-CM | POA: Diagnosis not present

## 2020-01-28 DIAGNOSIS — Z7901 Long term (current) use of anticoagulants: Secondary | ICD-10-CM | POA: Diagnosis not present

## 2020-01-28 DIAGNOSIS — I469 Cardiac arrest, cause unspecified: Secondary | ICD-10-CM | POA: Diagnosis not present

## 2020-01-28 DIAGNOSIS — Z720 Tobacco use: Secondary | ICD-10-CM | POA: Diagnosis not present

## 2020-01-28 DIAGNOSIS — K922 Gastrointestinal hemorrhage, unspecified: Secondary | ICD-10-CM | POA: Diagnosis not present

## 2020-03-23 DIAGNOSIS — T84019D Broken internal joint prosthesis, unspecified site, subsequent encounter: Secondary | ICD-10-CM | POA: Diagnosis not present

## 2020-03-23 DIAGNOSIS — Z01812 Encounter for preprocedural laboratory examination: Secondary | ICD-10-CM | POA: Diagnosis not present

## 2020-03-23 DIAGNOSIS — Z20822 Contact with and (suspected) exposure to covid-19: Secondary | ICD-10-CM | POA: Diagnosis not present

## 2020-03-23 DIAGNOSIS — M25552 Pain in left hip: Secondary | ICD-10-CM | POA: Diagnosis not present

## 2020-03-24 DIAGNOSIS — Z9181 History of falling: Secondary | ICD-10-CM | POA: Diagnosis not present

## 2020-03-24 DIAGNOSIS — E785 Hyperlipidemia, unspecified: Secondary | ICD-10-CM | POA: Diagnosis not present

## 2020-03-24 DIAGNOSIS — Z0000 Encounter for general adult medical examination without abnormal findings: Secondary | ICD-10-CM | POA: Diagnosis not present

## 2020-03-24 DIAGNOSIS — Z1331 Encounter for screening for depression: Secondary | ICD-10-CM | POA: Diagnosis not present

## 2020-03-24 DIAGNOSIS — Z6835 Body mass index (BMI) 35.0-35.9, adult: Secondary | ICD-10-CM | POA: Diagnosis not present

## 2020-03-25 DIAGNOSIS — M25552 Pain in left hip: Secondary | ICD-10-CM | POA: Diagnosis not present

## 2020-03-25 DIAGNOSIS — T84068A Wear of articular bearing surface of other internal prosthetic joint, initial encounter: Secondary | ICD-10-CM | POA: Diagnosis not present

## 2020-03-25 DIAGNOSIS — Z96642 Presence of left artificial hip joint: Secondary | ICD-10-CM | POA: Diagnosis not present

## 2020-03-29 DIAGNOSIS — Z7901 Long term (current) use of anticoagulants: Secondary | ICD-10-CM | POA: Diagnosis not present

## 2020-03-29 DIAGNOSIS — Z6836 Body mass index (BMI) 36.0-36.9, adult: Secondary | ICD-10-CM | POA: Diagnosis not present

## 2020-03-29 DIAGNOSIS — M1990 Unspecified osteoarthritis, unspecified site: Secondary | ICD-10-CM | POA: Diagnosis not present

## 2020-03-29 DIAGNOSIS — J449 Chronic obstructive pulmonary disease, unspecified: Secondary | ICD-10-CM | POA: Diagnosis not present

## 2020-03-29 DIAGNOSIS — I5042 Chronic combined systolic (congestive) and diastolic (congestive) heart failure: Secondary | ICD-10-CM | POA: Diagnosis not present

## 2020-03-30 DIAGNOSIS — Z96649 Presence of unspecified artificial hip joint: Secondary | ICD-10-CM | POA: Diagnosis not present

## 2020-03-30 DIAGNOSIS — T8489XA Other specified complication of internal orthopedic prosthetic devices, implants and grafts, initial encounter: Secondary | ICD-10-CM | POA: Diagnosis not present

## 2020-03-30 DIAGNOSIS — Z472 Encounter for removal of internal fixation device: Secondary | ICD-10-CM | POA: Diagnosis not present

## 2020-03-30 DIAGNOSIS — T84068A Wear of articular bearing surface of other internal prosthetic joint, initial encounter: Secondary | ICD-10-CM | POA: Diagnosis not present

## 2020-03-30 DIAGNOSIS — T84051A Periprosthetic osteolysis of internal prosthetic left hip joint, initial encounter: Secondary | ICD-10-CM | POA: Diagnosis not present

## 2020-03-30 DIAGNOSIS — M25552 Pain in left hip: Secondary | ICD-10-CM | POA: Diagnosis not present

## 2020-03-30 DIAGNOSIS — T84068D Wear of articular bearing surface of other internal prosthetic joint, subsequent encounter: Secondary | ICD-10-CM | POA: Diagnosis not present

## 2020-03-30 DIAGNOSIS — Z96642 Presence of left artificial hip joint: Secondary | ICD-10-CM | POA: Diagnosis not present

## 2020-03-30 DIAGNOSIS — G8918 Other acute postprocedural pain: Secondary | ICD-10-CM | POA: Diagnosis not present

## 2020-03-30 DIAGNOSIS — T84031A Mechanical loosening of internal left hip prosthetic joint, initial encounter: Secondary | ICD-10-CM | POA: Diagnosis not present

## 2020-03-31 DIAGNOSIS — Z4659 Encounter for fitting and adjustment of other gastrointestinal appliance and device: Secondary | ICD-10-CM | POA: Diagnosis not present

## 2020-03-31 DIAGNOSIS — E872 Acidosis: Secondary | ICD-10-CM | POA: Diagnosis not present

## 2020-03-31 DIAGNOSIS — I130 Hypertensive heart and chronic kidney disease with heart failure and stage 1 through stage 4 chronic kidney disease, or unspecified chronic kidney disease: Secondary | ICD-10-CM | POA: Diagnosis not present

## 2020-03-31 DIAGNOSIS — N183 Chronic kidney disease, stage 3 unspecified: Secondary | ICD-10-CM | POA: Diagnosis not present

## 2020-03-31 DIAGNOSIS — J9612 Chronic respiratory failure with hypercapnia: Secondary | ICD-10-CM | POA: Diagnosis not present

## 2020-03-31 DIAGNOSIS — G8918 Other acute postprocedural pain: Secondary | ICD-10-CM | POA: Diagnosis not present

## 2020-03-31 DIAGNOSIS — Z954 Presence of other heart-valve replacement: Secondary | ICD-10-CM | POA: Diagnosis not present

## 2020-03-31 DIAGNOSIS — R4182 Altered mental status, unspecified: Secondary | ICD-10-CM | POA: Diagnosis not present

## 2020-03-31 DIAGNOSIS — I5040 Unspecified combined systolic (congestive) and diastolic (congestive) heart failure: Secondary | ICD-10-CM | POA: Diagnosis not present

## 2020-03-31 DIAGNOSIS — T84051A Periprosthetic osteolysis of internal prosthetic left hip joint, initial encounter: Secondary | ICD-10-CM | POA: Diagnosis not present

## 2020-03-31 DIAGNOSIS — I459 Conduction disorder, unspecified: Secondary | ICD-10-CM | POA: Diagnosis not present

## 2020-03-31 DIAGNOSIS — J9611 Chronic respiratory failure with hypoxia: Secondary | ICD-10-CM | POA: Diagnosis not present

## 2020-03-31 DIAGNOSIS — J9602 Acute respiratory failure with hypercapnia: Secondary | ICD-10-CM | POA: Diagnosis not present

## 2020-03-31 DIAGNOSIS — Z96642 Presence of left artificial hip joint: Secondary | ICD-10-CM | POA: Diagnosis not present

## 2020-04-01 DIAGNOSIS — T84031A Mechanical loosening of internal left hip prosthetic joint, initial encounter: Secondary | ICD-10-CM | POA: Diagnosis present

## 2020-04-01 DIAGNOSIS — J9601 Acute respiratory failure with hypoxia: Secondary | ICD-10-CM | POA: Diagnosis not present

## 2020-04-01 DIAGNOSIS — J449 Chronic obstructive pulmonary disease, unspecified: Secondary | ICD-10-CM | POA: Diagnosis not present

## 2020-04-01 DIAGNOSIS — N183 Chronic kidney disease, stage 3 unspecified: Secondary | ICD-10-CM | POA: Diagnosis present

## 2020-04-01 DIAGNOSIS — J9612 Chronic respiratory failure with hypercapnia: Secondary | ICD-10-CM | POA: Diagnosis not present

## 2020-04-01 DIAGNOSIS — I5040 Unspecified combined systolic (congestive) and diastolic (congestive) heart failure: Secondary | ICD-10-CM | POA: Diagnosis not present

## 2020-04-01 DIAGNOSIS — J9622 Acute and chronic respiratory failure with hypercapnia: Secondary | ICD-10-CM | POA: Diagnosis not present

## 2020-04-01 DIAGNOSIS — T84051A Periprosthetic osteolysis of internal prosthetic left hip joint, initial encounter: Secondary | ICD-10-CM | POA: Diagnosis not present

## 2020-04-01 DIAGNOSIS — E785 Hyperlipidemia, unspecified: Secondary | ICD-10-CM | POA: Diagnosis present

## 2020-04-01 DIAGNOSIS — Z96649 Presence of unspecified artificial hip joint: Secondary | ICD-10-CM | POA: Diagnosis not present

## 2020-04-01 DIAGNOSIS — Z9911 Dependence on respirator [ventilator] status: Secondary | ICD-10-CM | POA: Diagnosis not present

## 2020-04-01 DIAGNOSIS — Z4659 Encounter for fitting and adjustment of other gastrointestinal appliance and device: Secondary | ICD-10-CM | POA: Diagnosis not present

## 2020-04-01 DIAGNOSIS — Z954 Presence of other heart-valve replacement: Secondary | ICD-10-CM | POA: Diagnosis not present

## 2020-04-01 DIAGNOSIS — E669 Obesity, unspecified: Secondary | ICD-10-CM | POA: Diagnosis present

## 2020-04-01 DIAGNOSIS — J9611 Chronic respiratory failure with hypoxia: Secondary | ICD-10-CM | POA: Diagnosis not present

## 2020-04-01 DIAGNOSIS — Z96642 Presence of left artificial hip joint: Secondary | ICD-10-CM | POA: Diagnosis not present

## 2020-04-01 DIAGNOSIS — Z952 Presence of prosthetic heart valve: Secondary | ICD-10-CM | POA: Diagnosis not present

## 2020-04-01 DIAGNOSIS — I959 Hypotension, unspecified: Secondary | ICD-10-CM | POA: Diagnosis not present

## 2020-04-01 DIAGNOSIS — F17210 Nicotine dependence, cigarettes, uncomplicated: Secondary | ICD-10-CM | POA: Diagnosis present

## 2020-04-01 DIAGNOSIS — J9602 Acute respiratory failure with hypercapnia: Secondary | ICD-10-CM | POA: Diagnosis not present

## 2020-04-01 DIAGNOSIS — N179 Acute kidney failure, unspecified: Secondary | ICD-10-CM | POA: Diagnosis not present

## 2020-04-01 DIAGNOSIS — T84068A Wear of articular bearing surface of other internal prosthetic joint, initial encounter: Secondary | ICD-10-CM | POA: Diagnosis not present

## 2020-04-01 DIAGNOSIS — Z9981 Dependence on supplemental oxygen: Secondary | ICD-10-CM | POA: Diagnosis not present

## 2020-04-01 DIAGNOSIS — I252 Old myocardial infarction: Secondary | ICD-10-CM | POA: Diagnosis not present

## 2020-04-01 DIAGNOSIS — E872 Acidosis: Secondary | ICD-10-CM | POA: Diagnosis not present

## 2020-04-01 DIAGNOSIS — J441 Chronic obstructive pulmonary disease with (acute) exacerbation: Secondary | ICD-10-CM | POA: Diagnosis not present

## 2020-04-01 DIAGNOSIS — I130 Hypertensive heart and chronic kidney disease with heart failure and stage 1 through stage 4 chronic kidney disease, or unspecified chronic kidney disease: Secondary | ICD-10-CM | POA: Diagnosis not present

## 2020-04-01 DIAGNOSIS — I5042 Chronic combined systolic (congestive) and diastolic (congestive) heart failure: Secondary | ICD-10-CM | POA: Diagnosis present

## 2020-04-01 DIAGNOSIS — D62 Acute posthemorrhagic anemia: Secondary | ICD-10-CM | POA: Diagnosis not present

## 2020-04-05 DIAGNOSIS — M25552 Pain in left hip: Secondary | ICD-10-CM | POA: Diagnosis not present

## 2020-04-05 DIAGNOSIS — J9612 Chronic respiratory failure with hypercapnia: Secondary | ICD-10-CM | POA: Diagnosis not present

## 2020-04-05 DIAGNOSIS — F17210 Nicotine dependence, cigarettes, uncomplicated: Secondary | ICD-10-CM | POA: Diagnosis not present

## 2020-04-05 DIAGNOSIS — Z7951 Long term (current) use of inhaled steroids: Secondary | ICD-10-CM | POA: Diagnosis not present

## 2020-04-05 DIAGNOSIS — Z952 Presence of prosthetic heart valve: Secondary | ICD-10-CM | POA: Diagnosis not present

## 2020-04-05 DIAGNOSIS — J441 Chronic obstructive pulmonary disease with (acute) exacerbation: Secondary | ICD-10-CM | POA: Diagnosis not present

## 2020-04-05 DIAGNOSIS — Z7901 Long term (current) use of anticoagulants: Secondary | ICD-10-CM | POA: Diagnosis not present

## 2020-04-05 DIAGNOSIS — T84019D Broken internal joint prosthesis, unspecified site, subsequent encounter: Secondary | ICD-10-CM | POA: Diagnosis not present

## 2020-04-05 DIAGNOSIS — J9611 Chronic respiratory failure with hypoxia: Secondary | ICD-10-CM | POA: Diagnosis not present

## 2020-04-07 DIAGNOSIS — M25552 Pain in left hip: Secondary | ICD-10-CM | POA: Diagnosis not present

## 2020-04-07 DIAGNOSIS — J9611 Chronic respiratory failure with hypoxia: Secondary | ICD-10-CM | POA: Diagnosis not present

## 2020-04-07 DIAGNOSIS — Z952 Presence of prosthetic heart valve: Secondary | ICD-10-CM | POA: Diagnosis not present

## 2020-04-07 DIAGNOSIS — R944 Abnormal results of kidney function studies: Secondary | ICD-10-CM | POA: Diagnosis not present

## 2020-04-07 DIAGNOSIS — F17210 Nicotine dependence, cigarettes, uncomplicated: Secondary | ICD-10-CM | POA: Diagnosis not present

## 2020-04-07 DIAGNOSIS — J441 Chronic obstructive pulmonary disease with (acute) exacerbation: Secondary | ICD-10-CM | POA: Diagnosis not present

## 2020-04-07 DIAGNOSIS — Z7901 Long term (current) use of anticoagulants: Secondary | ICD-10-CM | POA: Diagnosis not present

## 2020-04-07 DIAGNOSIS — T84019D Broken internal joint prosthesis, unspecified site, subsequent encounter: Secondary | ICD-10-CM | POA: Diagnosis not present

## 2020-04-07 DIAGNOSIS — J9612 Chronic respiratory failure with hypercapnia: Secondary | ICD-10-CM | POA: Diagnosis not present

## 2020-04-09 DIAGNOSIS — Z952 Presence of prosthetic heart valve: Secondary | ICD-10-CM | POA: Diagnosis not present

## 2020-04-09 DIAGNOSIS — J441 Chronic obstructive pulmonary disease with (acute) exacerbation: Secondary | ICD-10-CM | POA: Diagnosis not present

## 2020-04-09 DIAGNOSIS — F17210 Nicotine dependence, cigarettes, uncomplicated: Secondary | ICD-10-CM | POA: Diagnosis not present

## 2020-04-09 DIAGNOSIS — Z7951 Long term (current) use of inhaled steroids: Secondary | ICD-10-CM | POA: Diagnosis not present

## 2020-04-09 DIAGNOSIS — J9612 Chronic respiratory failure with hypercapnia: Secondary | ICD-10-CM | POA: Diagnosis not present

## 2020-04-09 DIAGNOSIS — J9611 Chronic respiratory failure with hypoxia: Secondary | ICD-10-CM | POA: Diagnosis not present

## 2020-04-09 DIAGNOSIS — Z7901 Long term (current) use of anticoagulants: Secondary | ICD-10-CM | POA: Diagnosis not present

## 2020-04-09 DIAGNOSIS — R944 Abnormal results of kidney function studies: Secondary | ICD-10-CM | POA: Diagnosis not present

## 2020-04-11 DIAGNOSIS — J9612 Chronic respiratory failure with hypercapnia: Secondary | ICD-10-CM | POA: Diagnosis not present

## 2020-04-11 DIAGNOSIS — R944 Abnormal results of kidney function studies: Secondary | ICD-10-CM | POA: Diagnosis not present

## 2020-04-11 DIAGNOSIS — Z9981 Dependence on supplemental oxygen: Secondary | ICD-10-CM | POA: Diagnosis not present

## 2020-04-11 DIAGNOSIS — Z952 Presence of prosthetic heart valve: Secondary | ICD-10-CM | POA: Diagnosis not present

## 2020-04-11 DIAGNOSIS — F17210 Nicotine dependence, cigarettes, uncomplicated: Secondary | ICD-10-CM | POA: Diagnosis not present

## 2020-04-11 DIAGNOSIS — J441 Chronic obstructive pulmonary disease with (acute) exacerbation: Secondary | ICD-10-CM | POA: Diagnosis not present

## 2020-04-11 DIAGNOSIS — Z7951 Long term (current) use of inhaled steroids: Secondary | ICD-10-CM | POA: Diagnosis not present

## 2020-04-11 DIAGNOSIS — Z7901 Long term (current) use of anticoagulants: Secondary | ICD-10-CM | POA: Diagnosis not present

## 2020-04-11 DIAGNOSIS — J9611 Chronic respiratory failure with hypoxia: Secondary | ICD-10-CM | POA: Diagnosis not present

## 2020-04-12 DIAGNOSIS — M25552 Pain in left hip: Secondary | ICD-10-CM | POA: Diagnosis not present

## 2020-04-12 DIAGNOSIS — T84019D Broken internal joint prosthesis, unspecified site, subsequent encounter: Secondary | ICD-10-CM | POA: Diagnosis not present

## 2020-04-14 DIAGNOSIS — Z96642 Presence of left artificial hip joint: Secondary | ICD-10-CM | POA: Diagnosis not present

## 2020-04-14 DIAGNOSIS — Z471 Aftercare following joint replacement surgery: Secondary | ICD-10-CM | POA: Diagnosis not present

## 2020-04-14 DIAGNOSIS — M79662 Pain in left lower leg: Secondary | ICD-10-CM | POA: Diagnosis not present

## 2020-04-15 DIAGNOSIS — T84019D Broken internal joint prosthesis, unspecified site, subsequent encounter: Secondary | ICD-10-CM | POA: Diagnosis not present

## 2020-04-15 DIAGNOSIS — M25552 Pain in left hip: Secondary | ICD-10-CM | POA: Diagnosis not present

## 2020-04-19 DIAGNOSIS — T84019D Broken internal joint prosthesis, unspecified site, subsequent encounter: Secondary | ICD-10-CM | POA: Diagnosis not present

## 2020-04-19 DIAGNOSIS — M25552 Pain in left hip: Secondary | ICD-10-CM | POA: Diagnosis not present

## 2020-04-21 DIAGNOSIS — T84019D Broken internal joint prosthesis, unspecified site, subsequent encounter: Secondary | ICD-10-CM | POA: Diagnosis not present

## 2020-04-21 DIAGNOSIS — M25552 Pain in left hip: Secondary | ICD-10-CM | POA: Diagnosis not present

## 2020-04-30 DIAGNOSIS — T84019D Broken internal joint prosthesis, unspecified site, subsequent encounter: Secondary | ICD-10-CM | POA: Diagnosis not present

## 2020-04-30 DIAGNOSIS — M25552 Pain in left hip: Secondary | ICD-10-CM | POA: Diagnosis not present

## 2020-05-03 DIAGNOSIS — J449 Chronic obstructive pulmonary disease, unspecified: Secondary | ICD-10-CM | POA: Diagnosis not present

## 2020-05-03 DIAGNOSIS — Z6835 Body mass index (BMI) 35.0-35.9, adult: Secondary | ICD-10-CM | POA: Diagnosis not present

## 2020-05-03 DIAGNOSIS — M1990 Unspecified osteoarthritis, unspecified site: Secondary | ICD-10-CM | POA: Diagnosis not present

## 2020-05-03 DIAGNOSIS — Z7901 Long term (current) use of anticoagulants: Secondary | ICD-10-CM | POA: Diagnosis not present

## 2020-05-03 DIAGNOSIS — I5042 Chronic combined systolic (congestive) and diastolic (congestive) heart failure: Secondary | ICD-10-CM | POA: Diagnosis not present

## 2020-05-11 DIAGNOSIS — N1832 Chronic kidney disease, stage 3b: Secondary | ICD-10-CM | POA: Diagnosis not present

## 2020-05-12 DIAGNOSIS — Z720 Tobacco use: Secondary | ICD-10-CM | POA: Diagnosis not present

## 2020-05-12 DIAGNOSIS — N1832 Chronic kidney disease, stage 3b: Secondary | ICD-10-CM | POA: Diagnosis not present

## 2020-05-12 DIAGNOSIS — J9611 Chronic respiratory failure with hypoxia: Secondary | ICD-10-CM | POA: Diagnosis not present

## 2020-05-12 DIAGNOSIS — E669 Obesity, unspecified: Secondary | ICD-10-CM | POA: Diagnosis not present

## 2020-05-12 DIAGNOSIS — J9612 Chronic respiratory failure with hypercapnia: Secondary | ICD-10-CM | POA: Diagnosis not present

## 2020-05-17 DIAGNOSIS — Z471 Aftercare following joint replacement surgery: Secondary | ICD-10-CM | POA: Diagnosis not present

## 2020-05-17 DIAGNOSIS — F17210 Nicotine dependence, cigarettes, uncomplicated: Secondary | ICD-10-CM | POA: Diagnosis not present

## 2020-05-17 DIAGNOSIS — Z96642 Presence of left artificial hip joint: Secondary | ICD-10-CM | POA: Diagnosis not present

## 2020-05-17 DIAGNOSIS — Z9181 History of falling: Secondary | ICD-10-CM | POA: Diagnosis not present

## 2020-05-18 DIAGNOSIS — T84019D Broken internal joint prosthesis, unspecified site, subsequent encounter: Secondary | ICD-10-CM | POA: Diagnosis not present

## 2020-05-18 DIAGNOSIS — M25552 Pain in left hip: Secondary | ICD-10-CM | POA: Diagnosis not present

## 2020-05-21 DIAGNOSIS — M25552 Pain in left hip: Secondary | ICD-10-CM | POA: Diagnosis not present

## 2020-05-21 DIAGNOSIS — T84019D Broken internal joint prosthesis, unspecified site, subsequent encounter: Secondary | ICD-10-CM | POA: Diagnosis not present

## 2020-05-25 DIAGNOSIS — Z7901 Long term (current) use of anticoagulants: Secondary | ICD-10-CM | POA: Diagnosis not present

## 2020-05-25 DIAGNOSIS — Z6835 Body mass index (BMI) 35.0-35.9, adult: Secondary | ICD-10-CM | POA: Diagnosis not present

## 2020-05-25 DIAGNOSIS — I5042 Chronic combined systolic (congestive) and diastolic (congestive) heart failure: Secondary | ICD-10-CM | POA: Diagnosis not present

## 2020-05-25 DIAGNOSIS — M1990 Unspecified osteoarthritis, unspecified site: Secondary | ICD-10-CM | POA: Diagnosis not present

## 2020-05-25 DIAGNOSIS — J9611 Chronic respiratory failure with hypoxia: Secondary | ICD-10-CM | POA: Diagnosis not present

## 2020-06-01 DIAGNOSIS — I469 Cardiac arrest, cause unspecified: Secondary | ICD-10-CM | POA: Diagnosis not present

## 2020-06-01 DIAGNOSIS — Z7901 Long term (current) use of anticoagulants: Secondary | ICD-10-CM | POA: Diagnosis not present

## 2020-06-01 DIAGNOSIS — Z6836 Body mass index (BMI) 36.0-36.9, adult: Secondary | ICD-10-CM | POA: Diagnosis not present

## 2020-06-01 DIAGNOSIS — Z952 Presence of prosthetic heart valve: Secondary | ICD-10-CM | POA: Diagnosis not present

## 2020-06-01 DIAGNOSIS — E669 Obesity, unspecified: Secondary | ICD-10-CM | POA: Diagnosis not present

## 2020-06-01 DIAGNOSIS — I5042 Chronic combined systolic (congestive) and diastolic (congestive) heart failure: Secondary | ICD-10-CM | POA: Diagnosis not present

## 2020-06-01 DIAGNOSIS — Z8679 Personal history of other diseases of the circulatory system: Secondary | ICD-10-CM | POA: Diagnosis not present

## 2020-06-01 DIAGNOSIS — I5041 Acute combined systolic (congestive) and diastolic (congestive) heart failure: Secondary | ICD-10-CM | POA: Diagnosis not present

## 2020-06-01 DIAGNOSIS — K922 Gastrointestinal hemorrhage, unspecified: Secondary | ICD-10-CM | POA: Diagnosis not present

## 2020-06-01 DIAGNOSIS — J449 Chronic obstructive pulmonary disease, unspecified: Secondary | ICD-10-CM | POA: Diagnosis not present

## 2020-06-01 DIAGNOSIS — I714 Abdominal aortic aneurysm, without rupture: Secondary | ICD-10-CM | POA: Diagnosis not present

## 2020-06-01 DIAGNOSIS — I5022 Chronic systolic (congestive) heart failure: Secondary | ICD-10-CM | POA: Diagnosis not present

## 2020-06-01 DIAGNOSIS — I110 Hypertensive heart disease with heart failure: Secondary | ICD-10-CM | POA: Diagnosis not present

## 2020-06-07 DIAGNOSIS — J449 Chronic obstructive pulmonary disease, unspecified: Secondary | ICD-10-CM | POA: Diagnosis not present

## 2020-06-07 DIAGNOSIS — Z87891 Personal history of nicotine dependence: Secondary | ICD-10-CM | POA: Diagnosis not present

## 2020-06-08 DIAGNOSIS — Z96641 Presence of right artificial hip joint: Secondary | ICD-10-CM | POA: Diagnosis not present

## 2020-06-08 DIAGNOSIS — I700 Atherosclerosis of aorta: Secondary | ICD-10-CM | POA: Diagnosis not present

## 2020-06-08 DIAGNOSIS — I714 Abdominal aortic aneurysm, without rupture: Secondary | ICD-10-CM | POA: Diagnosis not present

## 2020-06-08 DIAGNOSIS — I2510 Atherosclerotic heart disease of native coronary artery without angina pectoris: Secondary | ICD-10-CM | POA: Diagnosis not present

## 2020-06-08 DIAGNOSIS — S2243XD Multiple fractures of ribs, bilateral, subsequent encounter for fracture with routine healing: Secondary | ICD-10-CM | POA: Diagnosis not present

## 2020-06-08 DIAGNOSIS — K429 Umbilical hernia without obstruction or gangrene: Secondary | ICD-10-CM | POA: Diagnosis not present

## 2020-06-08 DIAGNOSIS — I712 Thoracic aortic aneurysm, without rupture: Secondary | ICD-10-CM | POA: Diagnosis not present

## 2020-06-08 DIAGNOSIS — K4090 Unilateral inguinal hernia, without obstruction or gangrene, not specified as recurrent: Secondary | ICD-10-CM | POA: Diagnosis not present

## 2020-06-25 DIAGNOSIS — Z87891 Personal history of nicotine dependence: Secondary | ICD-10-CM | POA: Diagnosis not present

## 2020-06-25 DIAGNOSIS — Z6836 Body mass index (BMI) 36.0-36.9, adult: Secondary | ICD-10-CM | POA: Diagnosis not present

## 2020-06-25 DIAGNOSIS — J449 Chronic obstructive pulmonary disease, unspecified: Secondary | ICD-10-CM | POA: Diagnosis not present

## 2020-06-25 DIAGNOSIS — Z7901 Long term (current) use of anticoagulants: Secondary | ICD-10-CM | POA: Diagnosis not present

## 2020-06-25 DIAGNOSIS — E782 Mixed hyperlipidemia: Secondary | ICD-10-CM | POA: Diagnosis not present

## 2020-06-25 DIAGNOSIS — I712 Thoracic aortic aneurysm, without rupture: Secondary | ICD-10-CM | POA: Diagnosis not present

## 2020-06-25 DIAGNOSIS — E559 Vitamin D deficiency, unspecified: Secondary | ICD-10-CM | POA: Diagnosis not present

## 2020-06-25 DIAGNOSIS — Z79899 Other long term (current) drug therapy: Secondary | ICD-10-CM | POA: Diagnosis not present

## 2020-07-06 DIAGNOSIS — J45909 Unspecified asthma, uncomplicated: Secondary | ICD-10-CM | POA: Diagnosis not present

## 2020-07-06 DIAGNOSIS — G4733 Obstructive sleep apnea (adult) (pediatric): Secondary | ICD-10-CM | POA: Diagnosis not present

## 2020-07-06 DIAGNOSIS — J449 Chronic obstructive pulmonary disease, unspecified: Secondary | ICD-10-CM | POA: Diagnosis not present

## 2020-07-19 DIAGNOSIS — Z96642 Presence of left artificial hip joint: Secondary | ICD-10-CM | POA: Diagnosis not present

## 2020-07-19 DIAGNOSIS — Z471 Aftercare following joint replacement surgery: Secondary | ICD-10-CM | POA: Diagnosis not present

## 2020-07-19 DIAGNOSIS — M247 Protrusio acetabuli: Secondary | ICD-10-CM | POA: Diagnosis not present

## 2020-07-19 DIAGNOSIS — M960 Pseudarthrosis after fusion or arthrodesis: Secondary | ICD-10-CM | POA: Diagnosis not present

## 2020-07-22 DIAGNOSIS — J449 Chronic obstructive pulmonary disease, unspecified: Secondary | ICD-10-CM | POA: Diagnosis not present

## 2020-07-22 DIAGNOSIS — Z952 Presence of prosthetic heart valve: Secondary | ICD-10-CM | POA: Diagnosis not present

## 2020-07-22 DIAGNOSIS — Z7901 Long term (current) use of anticoagulants: Secondary | ICD-10-CM | POA: Diagnosis not present

## 2020-07-22 DIAGNOSIS — I712 Thoracic aortic aneurysm, without rupture: Secondary | ICD-10-CM | POA: Diagnosis not present

## 2020-07-22 DIAGNOSIS — I2510 Atherosclerotic heart disease of native coronary artery without angina pectoris: Secondary | ICD-10-CM | POA: Diagnosis not present

## 2020-07-27 DIAGNOSIS — E782 Mixed hyperlipidemia: Secondary | ICD-10-CM | POA: Diagnosis not present

## 2020-07-27 DIAGNOSIS — J449 Chronic obstructive pulmonary disease, unspecified: Secondary | ICD-10-CM | POA: Diagnosis not present

## 2020-07-27 DIAGNOSIS — E559 Vitamin D deficiency, unspecified: Secondary | ICD-10-CM | POA: Diagnosis not present

## 2020-07-27 DIAGNOSIS — Z7901 Long term (current) use of anticoagulants: Secondary | ICD-10-CM | POA: Diagnosis not present

## 2020-07-27 DIAGNOSIS — J9611 Chronic respiratory failure with hypoxia: Secondary | ICD-10-CM | POA: Diagnosis not present

## 2020-08-31 DIAGNOSIS — T466X5A Adverse effect of antihyperlipidemic and antiarteriosclerotic drugs, initial encounter: Secondary | ICD-10-CM | POA: Diagnosis not present

## 2020-08-31 DIAGNOSIS — Z7901 Long term (current) use of anticoagulants: Secondary | ICD-10-CM | POA: Diagnosis not present

## 2020-08-31 DIAGNOSIS — J449 Chronic obstructive pulmonary disease, unspecified: Secondary | ICD-10-CM | POA: Diagnosis not present

## 2020-08-31 DIAGNOSIS — I5042 Chronic combined systolic (congestive) and diastolic (congestive) heart failure: Secondary | ICD-10-CM | POA: Diagnosis not present

## 2020-08-31 DIAGNOSIS — J9611 Chronic respiratory failure with hypoxia: Secondary | ICD-10-CM | POA: Diagnosis not present

## 2020-09-13 DIAGNOSIS — N1832 Chronic kidney disease, stage 3b: Secondary | ICD-10-CM | POA: Diagnosis not present

## 2020-09-14 DIAGNOSIS — J9612 Chronic respiratory failure with hypercapnia: Secondary | ICD-10-CM | POA: Diagnosis not present

## 2020-09-14 DIAGNOSIS — J9611 Chronic respiratory failure with hypoxia: Secondary | ICD-10-CM | POA: Diagnosis not present

## 2020-09-14 DIAGNOSIS — N1832 Chronic kidney disease, stage 3b: Secondary | ICD-10-CM | POA: Diagnosis not present

## 2020-09-14 DIAGNOSIS — E669 Obesity, unspecified: Secondary | ICD-10-CM | POA: Diagnosis not present

## 2020-09-14 DIAGNOSIS — Z720 Tobacco use: Secondary | ICD-10-CM | POA: Diagnosis not present

## 2020-10-04 DIAGNOSIS — Z7901 Long term (current) use of anticoagulants: Secondary | ICD-10-CM | POA: Diagnosis not present

## 2020-10-04 DIAGNOSIS — J449 Chronic obstructive pulmonary disease, unspecified: Secondary | ICD-10-CM | POA: Diagnosis not present

## 2020-10-04 DIAGNOSIS — Z6837 Body mass index (BMI) 37.0-37.9, adult: Secondary | ICD-10-CM | POA: Diagnosis not present

## 2020-10-04 DIAGNOSIS — Z87891 Personal history of nicotine dependence: Secondary | ICD-10-CM | POA: Diagnosis not present

## 2020-10-04 DIAGNOSIS — G4730 Sleep apnea, unspecified: Secondary | ICD-10-CM | POA: Diagnosis not present

## 2020-10-04 DIAGNOSIS — I5042 Chronic combined systolic (congestive) and diastolic (congestive) heart failure: Secondary | ICD-10-CM | POA: Diagnosis not present

## 2020-11-09 DIAGNOSIS — J449 Chronic obstructive pulmonary disease, unspecified: Secondary | ICD-10-CM | POA: Diagnosis not present

## 2020-11-09 DIAGNOSIS — J45909 Unspecified asthma, uncomplicated: Secondary | ICD-10-CM | POA: Diagnosis not present

## 2020-11-09 DIAGNOSIS — R0602 Shortness of breath: Secondary | ICD-10-CM | POA: Diagnosis not present

## 2020-11-12 DIAGNOSIS — J449 Chronic obstructive pulmonary disease, unspecified: Secondary | ICD-10-CM | POA: Diagnosis not present

## 2020-11-12 DIAGNOSIS — Z7901 Long term (current) use of anticoagulants: Secondary | ICD-10-CM | POA: Diagnosis not present

## 2020-11-12 DIAGNOSIS — I5042 Chronic combined systolic (congestive) and diastolic (congestive) heart failure: Secondary | ICD-10-CM | POA: Diagnosis not present

## 2020-11-12 DIAGNOSIS — Z6838 Body mass index (BMI) 38.0-38.9, adult: Secondary | ICD-10-CM | POA: Diagnosis not present

## 2020-11-16 DIAGNOSIS — J45909 Unspecified asthma, uncomplicated: Secondary | ICD-10-CM | POA: Diagnosis not present

## 2020-11-16 DIAGNOSIS — J449 Chronic obstructive pulmonary disease, unspecified: Secondary | ICD-10-CM | POA: Diagnosis not present

## 2020-11-16 DIAGNOSIS — G4733 Obstructive sleep apnea (adult) (pediatric): Secondary | ICD-10-CM | POA: Diagnosis not present

## 2020-12-14 DIAGNOSIS — R0602 Shortness of breath: Secondary | ICD-10-CM | POA: Diagnosis not present

## 2020-12-14 DIAGNOSIS — J441 Chronic obstructive pulmonary disease with (acute) exacerbation: Secondary | ICD-10-CM | POA: Diagnosis not present

## 2020-12-14 DIAGNOSIS — J449 Chronic obstructive pulmonary disease, unspecified: Secondary | ICD-10-CM | POA: Diagnosis not present

## 2020-12-14 DIAGNOSIS — J45901 Unspecified asthma with (acute) exacerbation: Secondary | ICD-10-CM | POA: Diagnosis not present

## 2020-12-16 DIAGNOSIS — E669 Obesity, unspecified: Secondary | ICD-10-CM | POA: Diagnosis not present

## 2020-12-16 DIAGNOSIS — I712 Thoracic aortic aneurysm, without rupture: Secondary | ICD-10-CM | POA: Diagnosis not present

## 2020-12-16 DIAGNOSIS — Z6837 Body mass index (BMI) 37.0-37.9, adult: Secondary | ICD-10-CM | POA: Diagnosis not present

## 2020-12-16 DIAGNOSIS — I5042 Chronic combined systolic (congestive) and diastolic (congestive) heart failure: Secondary | ICD-10-CM | POA: Diagnosis not present

## 2020-12-16 DIAGNOSIS — J9611 Chronic respiratory failure with hypoxia: Secondary | ICD-10-CM | POA: Diagnosis not present

## 2020-12-16 DIAGNOSIS — J449 Chronic obstructive pulmonary disease, unspecified: Secondary | ICD-10-CM | POA: Diagnosis not present

## 2020-12-16 DIAGNOSIS — Z7901 Long term (current) use of anticoagulants: Secondary | ICD-10-CM | POA: Diagnosis not present

## 2020-12-28 DIAGNOSIS — J449 Chronic obstructive pulmonary disease, unspecified: Secondary | ICD-10-CM | POA: Diagnosis not present

## 2020-12-28 DIAGNOSIS — J45909 Unspecified asthma, uncomplicated: Secondary | ICD-10-CM | POA: Diagnosis not present

## 2021-01-07 DIAGNOSIS — Z952 Presence of prosthetic heart valve: Secondary | ICD-10-CM | POA: Diagnosis not present

## 2021-01-07 DIAGNOSIS — I509 Heart failure, unspecified: Secondary | ICD-10-CM | POA: Diagnosis not present

## 2021-01-07 DIAGNOSIS — J449 Chronic obstructive pulmonary disease, unspecified: Secondary | ICD-10-CM | POA: Diagnosis not present

## 2021-01-07 DIAGNOSIS — I493 Ventricular premature depolarization: Secondary | ICD-10-CM | POA: Diagnosis not present

## 2021-01-07 DIAGNOSIS — Z20822 Contact with and (suspected) exposure to covid-19: Secondary | ICD-10-CM | POA: Diagnosis not present

## 2021-01-07 DIAGNOSIS — I5022 Chronic systolic (congestive) heart failure: Secondary | ICD-10-CM | POA: Diagnosis not present

## 2021-01-07 DIAGNOSIS — Z9114 Patient's other noncompliance with medication regimen: Secondary | ICD-10-CM | POA: Diagnosis not present

## 2021-01-07 DIAGNOSIS — I129 Hypertensive chronic kidney disease with stage 1 through stage 4 chronic kidney disease, or unspecified chronic kidney disease: Secondary | ICD-10-CM | POA: Diagnosis not present

## 2021-01-07 DIAGNOSIS — Z66 Do not resuscitate: Secondary | ICD-10-CM | POA: Diagnosis not present

## 2021-01-07 DIAGNOSIS — I469 Cardiac arrest, cause unspecified: Secondary | ICD-10-CM | POA: Diagnosis not present

## 2021-01-07 DIAGNOSIS — I714 Abdominal aortic aneurysm, without rupture: Secondary | ICD-10-CM | POA: Diagnosis not present

## 2021-01-07 DIAGNOSIS — I445 Left posterior fascicular block: Secondary | ICD-10-CM | POA: Diagnosis not present

## 2021-01-07 DIAGNOSIS — I110 Hypertensive heart disease with heart failure: Secondary | ICD-10-CM | POA: Diagnosis not present

## 2021-01-07 DIAGNOSIS — J441 Chronic obstructive pulmonary disease with (acute) exacerbation: Secondary | ICD-10-CM | POA: Diagnosis not present

## 2021-01-07 DIAGNOSIS — A419 Sepsis, unspecified organism: Secondary | ICD-10-CM | POA: Diagnosis not present

## 2021-01-07 DIAGNOSIS — R0602 Shortness of breath: Secondary | ICD-10-CM | POA: Diagnosis not present

## 2021-01-07 DIAGNOSIS — I130 Hypertensive heart and chronic kidney disease with heart failure and stage 1 through stage 4 chronic kidney disease, or unspecified chronic kidney disease: Secondary | ICD-10-CM | POA: Diagnosis not present

## 2021-01-07 DIAGNOSIS — J9621 Acute and chronic respiratory failure with hypoxia: Secondary | ICD-10-CM | POA: Diagnosis not present

## 2021-01-07 DIAGNOSIS — I517 Cardiomegaly: Secondary | ICD-10-CM | POA: Diagnosis not present

## 2021-01-07 DIAGNOSIS — Z8616 Personal history of COVID-19: Secondary | ICD-10-CM | POA: Diagnosis not present

## 2021-01-07 DIAGNOSIS — Z720 Tobacco use: Secondary | ICD-10-CM | POA: Diagnosis not present

## 2021-01-07 DIAGNOSIS — Z7901 Long term (current) use of anticoagulants: Secondary | ICD-10-CM | POA: Diagnosis not present

## 2021-01-07 DIAGNOSIS — Z8679 Personal history of other diseases of the circulatory system: Secondary | ICD-10-CM | POA: Diagnosis not present

## 2021-01-07 DIAGNOSIS — J9611 Chronic respiratory failure with hypoxia: Secondary | ICD-10-CM | POA: Diagnosis not present

## 2021-01-07 DIAGNOSIS — J9622 Acute and chronic respiratory failure with hypercapnia: Secondary | ICD-10-CM | POA: Diagnosis not present

## 2021-01-07 DIAGNOSIS — I5023 Acute on chronic systolic (congestive) heart failure: Secondary | ICD-10-CM | POA: Diagnosis not present

## 2021-01-07 DIAGNOSIS — N1831 Chronic kidney disease, stage 3a: Secondary | ICD-10-CM | POA: Diagnosis not present

## 2021-01-08 DIAGNOSIS — Q632 Ectopic kidney: Secondary | ICD-10-CM | POA: Diagnosis not present

## 2021-01-08 DIAGNOSIS — A419 Sepsis, unspecified organism: Secondary | ICD-10-CM | POA: Diagnosis not present

## 2021-01-08 DIAGNOSIS — N189 Chronic kidney disease, unspecified: Secondary | ICD-10-CM | POA: Diagnosis not present

## 2021-01-08 DIAGNOSIS — I493 Ventricular premature depolarization: Secondary | ICD-10-CM | POA: Diagnosis not present

## 2021-01-08 DIAGNOSIS — Z930 Tracheostomy status: Secondary | ICD-10-CM | POA: Diagnosis not present

## 2021-01-08 DIAGNOSIS — Z9119 Patient's noncompliance with other medical treatment and regimen: Secondary | ICD-10-CM | POA: Diagnosis not present

## 2021-01-08 DIAGNOSIS — Z952 Presence of prosthetic heart valve: Secondary | ICD-10-CM | POA: Diagnosis not present

## 2021-01-08 DIAGNOSIS — Z8616 Personal history of COVID-19: Secondary | ICD-10-CM | POA: Diagnosis not present

## 2021-01-08 DIAGNOSIS — R0902 Hypoxemia: Secondary | ICD-10-CM | POA: Diagnosis not present

## 2021-01-08 DIAGNOSIS — I429 Cardiomyopathy, unspecified: Secondary | ICD-10-CM | POA: Diagnosis present

## 2021-01-08 DIAGNOSIS — I959 Hypotension, unspecified: Secondary | ICD-10-CM | POA: Diagnosis not present

## 2021-01-08 DIAGNOSIS — J9601 Acute respiratory failure with hypoxia: Secondary | ICD-10-CM | POA: Diagnosis not present

## 2021-01-08 DIAGNOSIS — I130 Hypertensive heart and chronic kidney disease with heart failure and stage 1 through stage 4 chronic kidney disease, or unspecified chronic kidney disease: Secondary | ICD-10-CM | POA: Diagnosis not present

## 2021-01-08 DIAGNOSIS — I509 Heart failure, unspecified: Secondary | ICD-10-CM | POA: Diagnosis not present

## 2021-01-08 DIAGNOSIS — I5022 Chronic systolic (congestive) heart failure: Secondary | ICD-10-CM | POA: Diagnosis not present

## 2021-01-08 DIAGNOSIS — I712 Thoracic aortic aneurysm, without rupture: Secondary | ICD-10-CM | POA: Diagnosis not present

## 2021-01-08 DIAGNOSIS — K2960 Other gastritis without bleeding: Secondary | ICD-10-CM | POA: Diagnosis not present

## 2021-01-08 DIAGNOSIS — N170 Acute kidney failure with tubular necrosis: Secondary | ICD-10-CM | POA: Diagnosis not present

## 2021-01-08 DIAGNOSIS — E46 Unspecified protein-calorie malnutrition: Secondary | ICD-10-CM | POA: Diagnosis not present

## 2021-01-08 DIAGNOSIS — Z9889 Other specified postprocedural states: Secondary | ICD-10-CM | POA: Diagnosis not present

## 2021-01-08 DIAGNOSIS — J9622 Acute and chronic respiratory failure with hypercapnia: Secondary | ICD-10-CM | POA: Diagnosis not present

## 2021-01-08 DIAGNOSIS — J156 Pneumonia due to other aerobic Gram-negative bacteria: Secondary | ICD-10-CM | POA: Diagnosis not present

## 2021-01-08 DIAGNOSIS — I517 Cardiomegaly: Secondary | ICD-10-CM | POA: Diagnosis not present

## 2021-01-08 DIAGNOSIS — Z9911 Dependence on respirator [ventilator] status: Secondary | ICD-10-CM | POA: Diagnosis not present

## 2021-01-08 DIAGNOSIS — E870 Hyperosmolality and hypernatremia: Secondary | ICD-10-CM | POA: Diagnosis present

## 2021-01-08 DIAGNOSIS — I358 Other nonrheumatic aortic valve disorders: Secondary | ICD-10-CM | POA: Diagnosis not present

## 2021-01-08 DIAGNOSIS — Z9981 Dependence on supplemental oxygen: Secondary | ICD-10-CM | POA: Diagnosis not present

## 2021-01-08 DIAGNOSIS — N179 Acute kidney failure, unspecified: Secondary | ICD-10-CM | POA: Diagnosis not present

## 2021-01-08 DIAGNOSIS — J9621 Acute and chronic respiratory failure with hypoxia: Secondary | ICD-10-CM | POA: Diagnosis not present

## 2021-01-08 DIAGNOSIS — R6520 Severe sepsis without septic shock: Secondary | ICD-10-CM | POA: Diagnosis not present

## 2021-01-08 DIAGNOSIS — J188 Other pneumonia, unspecified organism: Secondary | ICD-10-CM | POA: Diagnosis not present

## 2021-01-08 DIAGNOSIS — J9811 Atelectasis: Secondary | ICD-10-CM | POA: Diagnosis not present

## 2021-01-08 DIAGNOSIS — N1831 Chronic kidney disease, stage 3a: Secondary | ICD-10-CM | POA: Diagnosis not present

## 2021-01-08 DIAGNOSIS — Q600 Renal agenesis, unilateral: Secondary | ICD-10-CM | POA: Diagnosis not present

## 2021-01-08 DIAGNOSIS — Z720 Tobacco use: Secondary | ICD-10-CM | POA: Diagnosis not present

## 2021-01-08 DIAGNOSIS — Z8674 Personal history of sudden cardiac arrest: Secondary | ICD-10-CM | POA: Diagnosis not present

## 2021-01-08 DIAGNOSIS — D631 Anemia in chronic kidney disease: Secondary | ICD-10-CM | POA: Diagnosis not present

## 2021-01-08 DIAGNOSIS — R000 Tachycardia, unspecified: Secondary | ICD-10-CM | POA: Diagnosis not present

## 2021-01-08 DIAGNOSIS — R578 Other shock: Secondary | ICD-10-CM | POA: Diagnosis not present

## 2021-01-08 DIAGNOSIS — R6521 Severe sepsis with septic shock: Secondary | ICD-10-CM | POA: Diagnosis not present

## 2021-01-08 DIAGNOSIS — I5020 Unspecified systolic (congestive) heart failure: Secondary | ICD-10-CM | POA: Diagnosis not present

## 2021-01-08 DIAGNOSIS — J9690 Respiratory failure, unspecified, unspecified whether with hypoxia or hypercapnia: Secondary | ICD-10-CM | POA: Diagnosis not present

## 2021-01-08 DIAGNOSIS — Z954 Presence of other heart-valve replacement: Secondary | ICD-10-CM | POA: Diagnosis not present

## 2021-01-08 DIAGNOSIS — Z4682 Encounter for fitting and adjustment of non-vascular catheter: Secondary | ICD-10-CM | POA: Diagnosis not present

## 2021-01-08 DIAGNOSIS — J449 Chronic obstructive pulmonary disease, unspecified: Secondary | ICD-10-CM | POA: Diagnosis not present

## 2021-01-08 DIAGNOSIS — D539 Nutritional anemia, unspecified: Secondary | ICD-10-CM | POA: Diagnosis present

## 2021-01-08 DIAGNOSIS — J181 Lobar pneumonia, unspecified organism: Secondary | ICD-10-CM | POA: Diagnosis not present

## 2021-01-08 DIAGNOSIS — F17200 Nicotine dependence, unspecified, uncomplicated: Secondary | ICD-10-CM | POA: Diagnosis not present

## 2021-01-08 DIAGNOSIS — J189 Pneumonia, unspecified organism: Secondary | ICD-10-CM | POA: Diagnosis not present

## 2021-01-08 DIAGNOSIS — I10 Essential (primary) hypertension: Secondary | ICD-10-CM | POA: Diagnosis not present

## 2021-01-08 DIAGNOSIS — R140 Abdominal distension (gaseous): Secondary | ICD-10-CM | POA: Diagnosis not present

## 2021-01-08 DIAGNOSIS — T17990A Other foreign object in respiratory tract, part unspecified in causing asphyxiation, initial encounter: Secondary | ICD-10-CM | POA: Diagnosis not present

## 2021-01-08 DIAGNOSIS — J90 Pleural effusion, not elsewhere classified: Secondary | ICD-10-CM | POA: Diagnosis not present

## 2021-01-08 DIAGNOSIS — J158 Pneumonia due to other specified bacteria: Secondary | ICD-10-CM | POA: Diagnosis not present

## 2021-01-08 DIAGNOSIS — A4159 Other Gram-negative sepsis: Secondary | ICD-10-CM | POA: Diagnosis not present

## 2021-01-08 DIAGNOSIS — I2510 Atherosclerotic heart disease of native coronary artery without angina pectoris: Secondary | ICD-10-CM | POA: Diagnosis not present

## 2021-01-08 DIAGNOSIS — J9691 Respiratory failure, unspecified with hypoxia: Secondary | ICD-10-CM | POA: Diagnosis not present

## 2021-01-08 DIAGNOSIS — T17890A Other foreign object in other parts of respiratory tract causing asphyxiation, initial encounter: Secondary | ICD-10-CM | POA: Diagnosis not present

## 2021-01-08 DIAGNOSIS — I5023 Acute on chronic systolic (congestive) heart failure: Secondary | ICD-10-CM | POA: Diagnosis present

## 2021-01-08 DIAGNOSIS — G9341 Metabolic encephalopathy: Secondary | ICD-10-CM | POA: Diagnosis not present

## 2021-01-08 DIAGNOSIS — R0602 Shortness of breath: Secondary | ICD-10-CM | POA: Diagnosis not present

## 2021-01-08 DIAGNOSIS — D696 Thrombocytopenia, unspecified: Secondary | ICD-10-CM | POA: Diagnosis present

## 2021-01-08 DIAGNOSIS — A414 Sepsis due to anaerobes: Secondary | ICD-10-CM | POA: Diagnosis not present

## 2021-01-08 DIAGNOSIS — E785 Hyperlipidemia, unspecified: Secondary | ICD-10-CM | POA: Diagnosis present

## 2021-01-08 DIAGNOSIS — I454 Nonspecific intraventricular block: Secondary | ICD-10-CM | POA: Diagnosis not present

## 2021-01-08 DIAGNOSIS — T82867A Thrombosis of cardiac prosthetic devices, implants and grafts, initial encounter: Secondary | ICD-10-CM | POA: Diagnosis not present

## 2021-01-08 DIAGNOSIS — N184 Chronic kidney disease, stage 4 (severe): Secondary | ICD-10-CM | POA: Diagnosis not present

## 2021-01-08 DIAGNOSIS — Z20822 Contact with and (suspected) exposure to covid-19: Secondary | ICD-10-CM | POA: Diagnosis not present

## 2021-01-08 DIAGNOSIS — I252 Old myocardial infarction: Secondary | ICD-10-CM | POA: Diagnosis not present

## 2021-01-08 DIAGNOSIS — J9602 Acute respiratory failure with hypercapnia: Secondary | ICD-10-CM | POA: Diagnosis not present

## 2021-01-08 DIAGNOSIS — I110 Hypertensive heart disease with heart failure: Secondary | ICD-10-CM | POA: Diagnosis not present

## 2021-01-08 DIAGNOSIS — J9819 Other pulmonary collapse: Secondary | ICD-10-CM | POA: Diagnosis not present

## 2021-01-08 DIAGNOSIS — I428 Other cardiomyopathies: Secondary | ICD-10-CM | POA: Diagnosis not present

## 2021-01-08 DIAGNOSIS — Z66 Do not resuscitate: Secondary | ICD-10-CM | POA: Diagnosis not present

## 2021-01-08 DIAGNOSIS — J441 Chronic obstructive pulmonary disease with (acute) exacerbation: Secondary | ICD-10-CM | POA: Diagnosis present

## 2021-01-08 DIAGNOSIS — E874 Mixed disorder of acid-base balance: Secondary | ICD-10-CM | POA: Diagnosis present

## 2021-01-08 DIAGNOSIS — R451 Restlessness and agitation: Secondary | ICD-10-CM | POA: Diagnosis not present

## 2021-01-08 DIAGNOSIS — Z0389 Encounter for observation for other suspected diseases and conditions ruled out: Secondary | ICD-10-CM | POA: Diagnosis not present

## 2021-01-08 DIAGNOSIS — J440 Chronic obstructive pulmonary disease with acute lower respiratory infection: Secondary | ICD-10-CM | POA: Diagnosis not present

## 2021-01-08 DIAGNOSIS — E875 Hyperkalemia: Secondary | ICD-10-CM | POA: Diagnosis not present

## 2021-01-08 DIAGNOSIS — N183 Chronic kidney disease, stage 3 unspecified: Secondary | ICD-10-CM | POA: Diagnosis not present

## 2021-01-08 DIAGNOSIS — F17210 Nicotine dependence, cigarettes, uncomplicated: Secondary | ICD-10-CM | POA: Diagnosis present

## 2021-01-08 DIAGNOSIS — I513 Intracardiac thrombosis, not elsewhere classified: Secondary | ICD-10-CM | POA: Diagnosis not present

## 2021-01-08 DIAGNOSIS — Z7901 Long term (current) use of anticoagulants: Secondary | ICD-10-CM | POA: Diagnosis not present

## 2021-01-08 DIAGNOSIS — I129 Hypertensive chronic kidney disease with stage 1 through stage 4 chronic kidney disease, or unspecified chronic kidney disease: Secondary | ICD-10-CM | POA: Diagnosis present

## 2021-01-08 DIAGNOSIS — Z452 Encounter for adjustment and management of vascular access device: Secondary | ICD-10-CM | POA: Diagnosis not present

## 2021-01-28 ENCOUNTER — Inpatient Hospital Stay
Admission: RE | Admit: 2021-01-28 | Discharge: 2021-03-08 | Disposition: A | Discharge status: Home Health | Payer: Medicare Other | Source: Other Acute Inpatient Hospital

## 2021-01-28 ENCOUNTER — Other Ambulatory Visit (HOSPITAL_COMMUNITY): Payer: Medicare Other

## 2021-01-28 DIAGNOSIS — E870 Hyperosmolality and hypernatremia: Secondary | ICD-10-CM | POA: Diagnosis not present

## 2021-01-28 DIAGNOSIS — Z952 Presence of prosthetic heart valve: Secondary | ICD-10-CM | POA: Diagnosis not present

## 2021-01-28 DIAGNOSIS — J156 Pneumonia due to other aerobic Gram-negative bacteria: Secondary | ICD-10-CM | POA: Diagnosis not present

## 2021-01-28 DIAGNOSIS — N19 Unspecified kidney failure: Secondary | ICD-10-CM

## 2021-01-28 DIAGNOSIS — Z9889 Other specified postprocedural states: Secondary | ICD-10-CM | POA: Diagnosis not present

## 2021-01-28 DIAGNOSIS — Z430 Encounter for attention to tracheostomy: Secondary | ICD-10-CM | POA: Diagnosis not present

## 2021-01-28 DIAGNOSIS — Z8674 Personal history of sudden cardiac arrest: Secondary | ICD-10-CM | POA: Diagnosis not present

## 2021-01-28 DIAGNOSIS — J9819 Other pulmonary collapse: Secondary | ICD-10-CM | POA: Diagnosis not present

## 2021-01-28 DIAGNOSIS — R1310 Dysphagia, unspecified: Secondary | ICD-10-CM | POA: Diagnosis present

## 2021-01-28 DIAGNOSIS — I712 Thoracic aortic aneurysm, without rupture: Secondary | ICD-10-CM | POA: Diagnosis not present

## 2021-01-28 DIAGNOSIS — R531 Weakness: Secondary | ICD-10-CM | POA: Diagnosis present

## 2021-01-28 DIAGNOSIS — N183 Chronic kidney disease, stage 3 unspecified: Secondary | ICD-10-CM | POA: Diagnosis present

## 2021-01-28 DIAGNOSIS — I517 Cardiomegaly: Secondary | ICD-10-CM | POA: Diagnosis not present

## 2021-01-28 DIAGNOSIS — I2510 Atherosclerotic heart disease of native coronary artery without angina pectoris: Secondary | ICD-10-CM | POA: Diagnosis not present

## 2021-01-28 DIAGNOSIS — K5641 Fecal impaction: Secondary | ICD-10-CM

## 2021-01-28 DIAGNOSIS — J189 Pneumonia, unspecified organism: Secondary | ICD-10-CM | POA: Diagnosis not present

## 2021-01-28 DIAGNOSIS — Z4682 Encounter for fitting and adjustment of non-vascular catheter: Secondary | ICD-10-CM | POA: Diagnosis not present

## 2021-01-28 DIAGNOSIS — J9602 Acute respiratory failure with hypercapnia: Secondary | ICD-10-CM | POA: Diagnosis not present

## 2021-01-28 DIAGNOSIS — R0902 Hypoxemia: Secondary | ICD-10-CM

## 2021-01-28 DIAGNOSIS — A419 Sepsis, unspecified organism: Secondary | ICD-10-CM | POA: Diagnosis not present

## 2021-01-28 DIAGNOSIS — J9811 Atelectasis: Secondary | ICD-10-CM | POA: Diagnosis not present

## 2021-01-28 DIAGNOSIS — J9621 Acute and chronic respiratory failure with hypoxia: Secondary | ICD-10-CM | POA: Diagnosis present

## 2021-01-28 DIAGNOSIS — J9600 Acute respiratory failure, unspecified whether with hypoxia or hypercapnia: Secondary | ICD-10-CM | POA: Diagnosis not present

## 2021-01-28 DIAGNOSIS — I130 Hypertensive heart and chronic kidney disease with heart failure and stage 1 through stage 4 chronic kidney disease, or unspecified chronic kidney disease: Secondary | ICD-10-CM | POA: Diagnosis not present

## 2021-01-28 DIAGNOSIS — N189 Chronic kidney disease, unspecified: Secondary | ICD-10-CM | POA: Diagnosis not present

## 2021-01-28 DIAGNOSIS — Z930 Tracheostomy status: Secondary | ICD-10-CM | POA: Diagnosis not present

## 2021-01-28 DIAGNOSIS — Z6835 Body mass index (BMI) 35.0-35.9, adult: Secondary | ICD-10-CM | POA: Diagnosis not present

## 2021-01-28 DIAGNOSIS — E119 Type 2 diabetes mellitus without complications: Secondary | ICD-10-CM | POA: Diagnosis not present

## 2021-01-28 DIAGNOSIS — J9610 Chronic respiratory failure, unspecified whether with hypoxia or hypercapnia: Secondary | ICD-10-CM | POA: Diagnosis not present

## 2021-01-28 DIAGNOSIS — J449 Chronic obstructive pulmonary disease, unspecified: Secondary | ICD-10-CM | POA: Diagnosis present

## 2021-01-28 DIAGNOSIS — J9601 Acute respiratory failure with hypoxia: Secondary | ICD-10-CM | POA: Diagnosis not present

## 2021-01-28 DIAGNOSIS — J9622 Acute and chronic respiratory failure with hypercapnia: Secondary | ICD-10-CM | POA: Diagnosis not present

## 2021-01-28 DIAGNOSIS — I129 Hypertensive chronic kidney disease with stage 1 through stage 4 chronic kidney disease, or unspecified chronic kidney disease: Secondary | ICD-10-CM | POA: Diagnosis not present

## 2021-01-28 DIAGNOSIS — E875 Hyperkalemia: Secondary | ICD-10-CM | POA: Diagnosis not present

## 2021-01-28 DIAGNOSIS — I358 Other nonrheumatic aortic valve disorders: Secondary | ICD-10-CM | POA: Diagnosis not present

## 2021-01-28 DIAGNOSIS — Z720 Tobacco use: Secondary | ICD-10-CM | POA: Diagnosis not present

## 2021-01-28 DIAGNOSIS — Z9911 Dependence on respirator [ventilator] status: Secondary | ICD-10-CM | POA: Diagnosis not present

## 2021-01-28 DIAGNOSIS — Z431 Encounter for attention to gastrostomy: Secondary | ICD-10-CM | POA: Diagnosis not present

## 2021-01-28 DIAGNOSIS — I252 Old myocardial infarction: Secondary | ICD-10-CM | POA: Diagnosis not present

## 2021-01-28 DIAGNOSIS — Z87891 Personal history of nicotine dependence: Secondary | ICD-10-CM | POA: Diagnosis not present

## 2021-01-28 DIAGNOSIS — N1832 Chronic kidney disease, stage 3b: Secondary | ICD-10-CM | POA: Diagnosis not present

## 2021-01-28 DIAGNOSIS — N281 Cyst of kidney, acquired: Secondary | ICD-10-CM | POA: Diagnosis not present

## 2021-01-28 DIAGNOSIS — Z96649 Presence of unspecified artificial hip joint: Secondary | ICD-10-CM | POA: Diagnosis present

## 2021-01-28 DIAGNOSIS — I429 Cardiomyopathy, unspecified: Secondary | ICD-10-CM | POA: Diagnosis present

## 2021-01-28 DIAGNOSIS — Z049 Encounter for examination and observation for unspecified reason: Secondary | ICD-10-CM | POA: Diagnosis not present

## 2021-01-28 DIAGNOSIS — I5020 Unspecified systolic (congestive) heart failure: Secondary | ICD-10-CM | POA: Diagnosis not present

## 2021-01-28 DIAGNOSIS — I428 Other cardiomyopathies: Secondary | ICD-10-CM | POA: Diagnosis not present

## 2021-01-28 DIAGNOSIS — D631 Anemia in chronic kidney disease: Secondary | ICD-10-CM | POA: Diagnosis not present

## 2021-01-28 DIAGNOSIS — N179 Acute kidney failure, unspecified: Secondary | ICD-10-CM | POA: Diagnosis not present

## 2021-01-28 DIAGNOSIS — Z931 Gastrostomy status: Secondary | ICD-10-CM

## 2021-01-28 DIAGNOSIS — J9690 Respiratory failure, unspecified, unspecified whether with hypoxia or hypercapnia: Secondary | ICD-10-CM

## 2021-01-28 DIAGNOSIS — Z7901 Long term (current) use of anticoagulants: Secondary | ICD-10-CM | POA: Diagnosis not present

## 2021-01-28 DIAGNOSIS — Z9981 Dependence on supplemental oxygen: Secondary | ICD-10-CM | POA: Diagnosis not present

## 2021-01-28 DIAGNOSIS — G7289 Other specified myopathies: Secondary | ICD-10-CM | POA: Diagnosis not present

## 2021-01-28 DIAGNOSIS — E46 Unspecified protein-calorie malnutrition: Secondary | ICD-10-CM | POA: Diagnosis present

## 2021-01-28 DIAGNOSIS — I509 Heart failure, unspecified: Secondary | ICD-10-CM | POA: Diagnosis not present

## 2021-01-28 HISTORY — DX: Pneumonia due to other gram-negative bacteria: J15.6

## 2021-01-28 HISTORY — DX: Chronic kidney disease, stage 3 unspecified: N18.30

## 2021-01-28 HISTORY — DX: Pneumonia due to other gram-negative bacteria: J15.69

## 2021-01-28 HISTORY — DX: Acute and chronic respiratory failure with hypoxia: J96.21

## 2021-01-28 HISTORY — DX: Chronic obstructive pulmonary disease, unspecified: J44.9

## 2021-01-28 LAB — HEPARIN LEVEL (UNFRACTIONATED)
Heparin Unfractionated: 0.25 IU/mL — ABNORMAL LOW (ref 0.30–0.70)
Heparin Unfractionated: 0.31 IU/mL (ref 0.30–0.70)

## 2021-01-28 LAB — BLOOD GAS, ARTERIAL
Acid-Base Excess: 2.7 mmol/L — ABNORMAL HIGH (ref 0.0–2.0)
Bicarbonate: 27.5 mmol/L (ref 20.0–28.0)
Drawn by: 164
FIO2: 40
O2 Saturation: 96.6 %
Patient temperature: 37
pCO2 arterial: 48.8 mmHg — ABNORMAL HIGH (ref 32.0–48.0)
pH, Arterial: 7.37 (ref 7.350–7.450)
pO2, Arterial: 89.9 mmHg (ref 83.0–108.0)

## 2021-01-28 LAB — CBC
HCT: 30.9 % — ABNORMAL LOW (ref 39.0–52.0)
Hemoglobin: 9.9 g/dL — ABNORMAL LOW (ref 13.0–17.0)
MCH: 33.3 pg (ref 26.0–34.0)
MCHC: 32 g/dL (ref 30.0–36.0)
MCV: 104 fL — ABNORMAL HIGH (ref 80.0–100.0)
Platelets: 203 10*3/uL (ref 150–400)
RBC: 2.97 MIL/uL — ABNORMAL LOW (ref 4.22–5.81)
RDW: 14.1 % (ref 11.5–15.5)
WBC: 6.9 10*3/uL (ref 4.0–10.5)
nRBC: 0 % (ref 0.0–0.2)

## 2021-01-28 LAB — URINALYSIS, ROUTINE W REFLEX MICROSCOPIC
Bacteria, UA: NONE SEEN
Bilirubin Urine: NEGATIVE
Glucose, UA: NEGATIVE mg/dL
Ketones, ur: NEGATIVE mg/dL
Nitrite: NEGATIVE
Protein, ur: 30 mg/dL — AB
Specific Gravity, Urine: 1.009 (ref 1.005–1.030)
WBC, UA: >50 WBC/hpf — ABNORMAL HIGH (ref 0–5)
pH: 6 (ref 5.0–8.0)

## 2021-01-28 LAB — PROTIME-INR
INR: 1.2 (ref 0.8–1.2)
Prothrombin Time: 15 seconds (ref 11.4–15.2)

## 2021-01-28 LAB — APTT: aPTT: 66 seconds — ABNORMAL HIGH (ref 24–36)

## 2021-01-28 MED ORDER — DIATRIZOATE MEGLUMINE & SODIUM 66-10 % PO SOLN
15.0000 mL | Freq: Once | ORAL | Status: AC
  Administered 2021-01-28: 15 mL via ORAL

## 2021-01-29 LAB — CBC WITH DIFFERENTIAL/PLATELET
Abs Immature Granulocytes: 0.12 10*3/uL — ABNORMAL HIGH (ref 0.00–0.07)
Basophils Absolute: 0 10*3/uL (ref 0.0–0.1)
Basophils Relative: 1 %
Eosinophils Absolute: 0.5 10*3/uL (ref 0.0–0.5)
Eosinophils Relative: 8 %
HCT: 32.2 % — ABNORMAL LOW (ref 39.0–52.0)
Hemoglobin: 10.2 g/dL — ABNORMAL LOW (ref 13.0–17.0)
Immature Granulocytes: 2 %
Lymphocytes Relative: 13 %
Lymphs Abs: 0.8 10*3/uL (ref 0.7–4.0)
MCH: 32.9 pg (ref 26.0–34.0)
MCHC: 31.7 g/dL (ref 30.0–36.0)
MCV: 103.9 fL — ABNORMAL HIGH (ref 80.0–100.0)
Monocytes Absolute: 0.4 10*3/uL (ref 0.1–1.0)
Monocytes Relative: 6 %
Neutro Abs: 4.6 10*3/uL (ref 1.7–7.7)
Neutrophils Relative %: 70 %
Platelets: 194 10*3/uL (ref 150–400)
RBC: 3.1 MIL/uL — ABNORMAL LOW (ref 4.22–5.81)
RDW: 13.9 % (ref 11.5–15.5)
WBC: 6.5 10*3/uL (ref 4.0–10.5)
nRBC: 0 % (ref 0.0–0.2)

## 2021-01-29 LAB — COMPREHENSIVE METABOLIC PANEL
ALT: 6 U/L (ref 0–44)
AST: 16 U/L (ref 15–41)
Albumin: 2.8 g/dL — ABNORMAL LOW (ref 3.5–5.0)
Alkaline Phosphatase: 76 U/L (ref 38–126)
Anion gap: 8 (ref 5–15)
BUN: 55 mg/dL — ABNORMAL HIGH (ref 6–20)
CO2: 30 mmol/L (ref 22–32)
Calcium: 9 mg/dL (ref 8.9–10.3)
Chloride: 106 mmol/L (ref 98–111)
Creatinine, Ser: 2.81 mg/dL — ABNORMAL HIGH (ref 0.61–1.24)
GFR, Estimated: 25 mL/min — ABNORMAL LOW (ref >60)
Glucose, Bld: 105 mg/dL — ABNORMAL HIGH (ref 70–99)
Potassium: 4.4 mmol/L (ref 3.5–5.1)
Sodium: 144 mmol/L (ref 135–145)
Total Bilirubin: 0.7 mg/dL (ref 0.3–1.2)
Total Protein: 6.6 g/dL (ref 6.5–8.1)

## 2021-01-29 LAB — PROTIME-INR
INR: 1.1 (ref 0.8–1.2)
Prothrombin Time: 14.3 seconds (ref 11.4–15.2)

## 2021-01-29 LAB — PHOSPHORUS: Phosphorus: 5.1 mg/dL — ABNORMAL HIGH (ref 2.5–4.6)

## 2021-01-29 LAB — HEPARIN LEVEL (UNFRACTIONATED)
Heparin Unfractionated: 0.32 IU/mL (ref 0.30–0.70)
Heparin Unfractionated: 0.31 IU/mL (ref 0.30–0.70)
Heparin Unfractionated: 0.34 IU/mL (ref 0.30–0.70)

## 2021-01-29 LAB — MAGNESIUM: Magnesium: 1.9 mg/dL (ref 1.7–2.4)

## 2021-01-29 LAB — TSH: TSH: 1.867 u[IU]/mL (ref 0.350–4.500)

## 2021-01-30 DIAGNOSIS — J156 Pneumonia due to other aerobic Gram-negative bacteria: Secondary | ICD-10-CM | POA: Diagnosis not present

## 2021-01-30 DIAGNOSIS — J449 Chronic obstructive pulmonary disease, unspecified: Secondary | ICD-10-CM | POA: Diagnosis not present

## 2021-01-30 DIAGNOSIS — N1832 Chronic kidney disease, stage 3b: Secondary | ICD-10-CM | POA: Diagnosis not present

## 2021-01-30 DIAGNOSIS — J9621 Acute and chronic respiratory failure with hypoxia: Secondary | ICD-10-CM

## 2021-01-30 LAB — RENAL FUNCTION PANEL
Albumin: 2.8 g/dL — ABNORMAL LOW (ref 3.5–5.0)
Anion gap: 10 (ref 5–15)
BUN: 52 mg/dL — ABNORMAL HIGH (ref 6–20)
CO2: 27 mmol/L (ref 22–32)
Calcium: 9 mg/dL (ref 8.9–10.3)
Chloride: 107 mmol/L (ref 98–111)
Creatinine, Ser: 2.73 mg/dL — ABNORMAL HIGH (ref 0.61–1.24)
GFR, Estimated: 26 mL/min — ABNORMAL LOW (ref >60)
Glucose, Bld: 116 mg/dL — ABNORMAL HIGH (ref 70–99)
Phosphorus: 5.5 mg/dL — ABNORMAL HIGH (ref 2.5–4.6)
Potassium: 4.2 mmol/L (ref 3.5–5.1)
Sodium: 144 mmol/L (ref 135–145)

## 2021-01-30 LAB — CBC
HCT: 33 % — ABNORMAL LOW (ref 39.0–52.0)
HCT: 33.4 % — ABNORMAL LOW (ref 39.0–52.0)
Hemoglobin: 10.3 g/dL — ABNORMAL LOW (ref 13.0–17.0)
Hemoglobin: 10.4 g/dL — ABNORMAL LOW (ref 13.0–17.0)
MCH: 32.4 pg (ref 26.0–34.0)
MCH: 32.8 pg (ref 26.0–34.0)
MCHC: 31.1 g/dL (ref 30.0–36.0)
MCHC: 31.2 g/dL (ref 30.0–36.0)
MCV: 104 fL — ABNORMAL HIGH (ref 80.0–100.0)
MCV: 105.1 fL — ABNORMAL HIGH (ref 80.0–100.0)
Platelets: 192 10*3/uL (ref 150–400)
Platelets: 200 10*3/uL (ref 150–400)
RBC: 3.14 MIL/uL — ABNORMAL LOW (ref 4.22–5.81)
RBC: 3.21 MIL/uL — ABNORMAL LOW (ref 4.22–5.81)
RDW: 13.8 % (ref 11.5–15.5)
RDW: 13.8 % (ref 11.5–15.5)
WBC: 7.1 10*3/uL (ref 4.0–10.5)
WBC: 7.2 10*3/uL (ref 4.0–10.5)
nRBC: 0 % (ref 0.0–0.2)
nRBC: 0 % (ref 0.0–0.2)

## 2021-01-30 LAB — URINE CULTURE: Culture: NO GROWTH

## 2021-01-30 LAB — HEPARIN LEVEL (UNFRACTIONATED)
Heparin Unfractionated: 0.39 IU/mL (ref 0.30–0.70)
Heparin Unfractionated: 0.34 IU/mL (ref 0.30–0.70)
Heparin Unfractionated: 0.39 IU/mL (ref 0.30–0.70)

## 2021-01-30 LAB — MAGNESIUM: Magnesium: 1.9 mg/dL (ref 1.7–2.4)

## 2021-01-30 NOTE — Consult Note (Signed)
Pulmonary Critical Care Medicine [place]  PULMONARY SERVICE  Date of Service: 01/30/2021  PULMONARY CRITICAL CARE CONSULT   Jackson Hahn  [id number]:[id number]  DOB: 08-Aug-1961   DOA: 01/28/2021  Referring Physician: Merton Border, MD  HPI: Jackson Hahn is a 60 y.o. male seen for follow up of Acute on Chronic Respiratory Failure.  Patient presents to [place] for further management weaning came into the acute care hospital with a history of coronary disease CHF CKD stage III end-stage COPD on chronic oxygen chronic anticoagulation with a prosthetic valve who came in with a subtherapeutic INR started on heparin drip along with steroids because of what was felt to be a COPD exacerbation.  The patient had been transferred out of the ICU to the medical floor however oxygenation got worse patient was apparently refusing to use the BiPAP ended up intubated on mechanical ventilation.  Also was noted to have near complete opacification of the left lung and was started on Mucomyst albuterol and also was anticipated for bronchoscopy.  Patient was found to have Moraxella catarrhalis as well and was started on Rocephin and later on changed over to cefepime.  Hospital course continue to be complicated with recurrence of mucous plugging patient had another bronchoscopy on the 24th.  Patient subsequently failed to come off of the ventilator.  He is now transferred to our facility for further management and weaning and we are going to make assessment as far as being able to come off the vent.  Review of Systems:  ROS performed and is unremarkable other than noted above.  Past Medical History Past Medical History:  Diagnosis Date  . CAD (coronary artery disease)  . Cardiomyopathy ([place])  . CHF (congestive heart failure) ([place])  . CKD (chronic kidney disease), stage III ([place])  . COPD (chronic obstructive pulmonary disease) ([place])  . Coronary artery disease  . Heart disease  . Heart valve  replaced 05/11/2017  . History of prosthetic aortic valve  . Hypertension  . Myocardial infarction ([place])  . Non compliance with medical treatment   Past Surgical History Past Surgical History:  Procedure Laterality Date  . ESOPHAGOSCOPY / EGD N/A 05/13/2017  Procedure: EGD; Surgeon: Ned Card, MD; Location: [facility] ENDO OR; Service: Gastroenterology; Laterality: N/A;  . TOTAL HIP REVISION Left 03/30/2020  Procedure: TOTAL HIP REVISION; Surgeon: Peggyann Shoals, MD; Location[facility] MAIN OR; Service: Orthopedics; Laterality: Left;  Marland Kitchen VALVE REPLACEMENT     Medications: Reviewed on Rounds  Physical Exam:  Vitals: Temperature is 99.0 pulse 80 respiratory rate 17 blood pressure is 116/65 saturations 96%  Ventilator Settings on pressure support FiO2 is 40% tidal volume 568 pressure 12/5  . General: Comfortable at this time . Eyes: Grossly normal lids, irises & conjunctiva . ENT: grossly tongue is normal . Neck: no obvious mass . Cardiovascular: S1-S2 normal no gallop or rub . Respiratory: No rhonchi no rales are noted at this time . Abdomen: Soft and nontender . Skin: no rash seen on limited exam . Musculoskeletal: not rigid . Psychiatric:unable to assess . Neurologic: no seizure no involuntary movements         Labs on Admission:  Basic Metabolic Panel: Recent Labs  Lab 01/29/21 0529 01/30/21 0028 01/30/21 0039  NA 144 144  --   K 4.4 4.2  --   CL 106 107  --   CO2 30 27  --   GLUCOSE 105* 116*  --   BUN 55* 52*  --  CREATININE 2.81* 2.73*  --   CALCIUM 9.0 9.0  --   MG 1.9  --  1.9  PHOS 5.1* 5.5*  --     Recent Labs  Lab 01/28/21 1740  PHART 7.370  PCO2ART 48.8*  PO2ART 89.9  HCO3 27.5  O2SAT 96.6    Liver Function Tests: Recent Labs  Lab 01/29/21 0529 01/30/21 0028  AST 16  --   ALT 6  --   ALKPHOS 76  --   BILITOT 0.7  --   PROT 6.6  --   ALBUMIN 2.8* 2.8*   No results for input(s): LIPASE, AMYLASE in the last 168 hours. No results  for input(s): AMMONIA in the last 168 hours.  CBC: Recent Labs  Lab 01/28/21 1735 01/29/21 0529 01/30/21 0039  WBC 6.9 6.5 7.2  NEUTROABS  --  4.6  --   HGB 9.9* 10.2* 10.4*  HCT 30.9* 32.2* 33.4*  MCV 104.0* 103.9* 104.0*  PLT 203 194 200    Cardiac Enzymes: No results for input(s): CKTOTAL, CKMB, CKMBINDEX, TROPONINI in the last 168 hours.  BNP (last 3 results) No results for input(s): BNP in the last 8760 hours.  ProBNP (last 3 results) No results for input(s): PROBNP in the last 8760 hours.   Radiological Exams on Admission: DG CHEST PORT 1 VIEW  Result Date: 01/28/2021 CLINICAL DATA:  Pneumonia. EXAM: PORTABLE CHEST 1 VIEW COMPARISON:  Radiograph 01/25/2021.  CT 01/11/2021 FINDINGS: Tracheostomy tube tip at the thoracic inlet. Post median sternotomy. Stable heart size and mediastinal contours. Persistent volume loss at the left lung base with ill-defined opacity and possible small left pleural effusion. Bronchial thickening in the right hemithorax without focal airspace disease. No pneumothorax. IMPRESSION: Persistent volume loss at the left lung base with ill-defined opacity and possible small left pleural effusion. Electronically Signed   By: Keith Rake M.D.   On: 01/28/2021 18:29   DG Abd Portable 1V  Result Date: 01/28/2021 CLINICAL DATA:  Peg placement. EXAM: PORTABLE ABDOMEN - 1 VIEW COMPARISON:  None. FINDINGS: Single AP portable supine view of the abdomen obtained after the installation of 20 cc Gastrografin. Gastrostomy tube projects over the stomach. Contrast opacifies the stomach without evidence of extravasation or leak. No bowel dilatation in the upper abdomen. IMPRESSION: Gastrostomy tube in the stomach without evidence of extravasation or leak. Electronically Signed   By: Keith Rake M.D.   On: 01/28/2021 18:30    Assessment/Plan Active Problems:   Acute on chronic respiratory failure with hypoxia (HCC)   End stage COPD (HCC)   Pneumonia due to  Moraxella catarrhalis (HCC)   Chronic kidney disease, stage III (moderate) (HCC)   1. Acute on chronic respiratory failure with hypoxia we will continue with trying to wean on pressure support currently patient is on a 12/5 wean doing fairly well. 2. End-stage COPD patient apparently was oxygen dependent at home the plan is going to be to continue with the oxygen therapy titrate as tolerated nebulizers as warranted 3. Pneumonia due to Moraxella catarrhalis patient had been treated with multiple rounds of antibiotics we will continue to follow along closely. 4. Chronic kidney disease stage III we will continue to monitor closely for baseline renal function.  I have personally seen and evaluated the patient, evaluated laboratory and imaging results, formulated the assessment and plan and placed orders. The Patient requires high complexity decision making with multiple systems involvement.  Case was discussed on Rounds with the Respiratory Therapy Director and the Respiratory staff  Time Spent 50minutes  Allyne Gee, MD [facility] Pulmonary Critical Care Medicine Sleep Medicine

## 2021-01-31 DIAGNOSIS — J156 Pneumonia due to other aerobic Gram-negative bacteria: Secondary | ICD-10-CM | POA: Diagnosis not present

## 2021-01-31 DIAGNOSIS — N1832 Chronic kidney disease, stage 3b: Secondary | ICD-10-CM | POA: Diagnosis not present

## 2021-01-31 DIAGNOSIS — J449 Chronic obstructive pulmonary disease, unspecified: Secondary | ICD-10-CM | POA: Diagnosis not present

## 2021-01-31 DIAGNOSIS — J9621 Acute and chronic respiratory failure with hypoxia: Secondary | ICD-10-CM | POA: Diagnosis not present

## 2021-01-31 LAB — RENAL FUNCTION PANEL
Albumin: 2.6 g/dL — ABNORMAL LOW (ref 3.5–5.0)
Anion gap: 7 (ref 5–15)
BUN: 51 mg/dL — ABNORMAL HIGH (ref 6–20)
CO2: 32 mmol/L (ref 22–32)
Calcium: 9.2 mg/dL (ref 8.9–10.3)
Chloride: 106 mmol/L (ref 98–111)
Creatinine, Ser: 2.67 mg/dL — ABNORMAL HIGH (ref 0.61–1.24)
GFR, Estimated: 27 mL/min — ABNORMAL LOW (ref >60)
Glucose, Bld: 109 mg/dL — ABNORMAL HIGH (ref 70–99)
Phosphorus: 4.7 mg/dL — ABNORMAL HIGH (ref 2.5–4.6)
Potassium: 4.2 mmol/L (ref 3.5–5.1)
Sodium: 145 mmol/L (ref 135–145)

## 2021-01-31 LAB — CBC
HCT: 32.7 % — ABNORMAL LOW (ref 39.0–52.0)
Hemoglobin: 9.9 g/dL — ABNORMAL LOW (ref 13.0–17.0)
MCH: 32.4 pg (ref 26.0–34.0)
MCHC: 30.3 g/dL (ref 30.0–36.0)
MCV: 106.9 fL — ABNORMAL HIGH (ref 80.0–100.0)
Platelets: 200 10*3/uL (ref 150–400)
RBC: 3.06 MIL/uL — ABNORMAL LOW (ref 4.22–5.81)
RDW: 13.7 % (ref 11.5–15.5)
WBC: 6.3 10*3/uL (ref 4.0–10.5)
nRBC: 0 % (ref 0.0–0.2)

## 2021-01-31 LAB — CULTURE, RESPIRATORY W GRAM STAIN: Culture: NORMAL

## 2021-01-31 LAB — MAGNESIUM: Magnesium: 1.8 mg/dL (ref 1.7–2.4)

## 2021-01-31 LAB — HEPARIN LEVEL (UNFRACTIONATED)
Heparin Unfractionated: 0.28 IU/mL — ABNORMAL LOW (ref 0.30–0.70)
Heparin Unfractionated: 0.13 IU/mL — ABNORMAL LOW (ref 0.30–0.70)
Heparin Unfractionated: 0.31 IU/mL (ref 0.30–0.70)

## 2021-01-31 NOTE — Progress Notes (Signed)
Pulmonary Critical Care Medicine [place]   PULMONARY CRITICAL CARE SERVICE  PROGRESS NOTE     Jackson Hahn  IHK:[id number]  DOB: Mar 19, 1961   DOA: 01/28/2021  Referring Physician: Merton Border, MD  HPI: Jackson Hahn is a 60 y.o. male seen for follow up of Acute on Chronic Respiratory Failure.  Patient right now with his on pressure support has been tolerating it well and will try to do 8-hour goal on pressure support  Medications: Reviewed on Rounds  Physical Exam:  Vitals: Temperature is 98.1 pulse 88 respiratory rate 21 blood pressure is 142/66 saturations 98%  Ventilator Settings pressure support with a pressure of 12/5  . General: Comfortable at this time . Eyes: Grossly normal lids, irises & conjunctiva . ENT: grossly tongue is normal . Neck: no obvious mass . Cardiovascular: S1 S2 normal no gallop . Respiratory: No rhonchi no rales are noted at this time . Abdomen: soft . Skin: no rash seen on limited exam . Musculoskeletal: not rigid . Psychiatric:unable to assess . Neurologic: no seizure no involuntary movements         Lab Data:   Basic Metabolic Panel: Recent Labs  Lab 01/29/21 0529 01/30/21 0028 01/30/21 0039 01/31/21 0357  NA 144 144  --  145  K 4.4 4.2  --  4.2  CL 106 107  --  106  CO2 30 27  --  32  GLUCOSE 105* 116*  --  109*  BUN 55* 52*  --  51*  CREATININE 2.81* 2.73*  --  2.67*  CALCIUM 9.0 9.0  --  9.2  MG 1.9  --  1.9 1.8  PHOS 5.1* 5.5*  --  4.7*    ABG: Recent Labs  Lab 01/28/21 1740  PHART 7.370  PCO2ART 48.8*  PO2ART 89.9  HCO3 27.5  O2SAT 96.6    Liver Function Tests: Recent Labs  Lab 01/29/21 0529 01/30/21 0028 01/31/21 0357  AST 16  --   --   ALT 6  --   --   ALKPHOS 76  --   --   BILITOT 0.7  --   --   PROT 6.6  --   --   ALBUMIN 2.8* 2.8* 2.6*   No results for input(s): LIPASE, AMYLASE in the last 168 hours. No results for input(s): AMMONIA in the last 168  hours.  CBC: Recent Labs  Lab 01/28/21 1735 01/29/21 0529 01/30/21 0039 01/30/21 1755 01/31/21 0357  WBC 6.9 6.5 7.2 7.1 6.3  NEUTROABS  --  4.6  --   --   --   HGB 9.9* 10.2* 10.4* 10.3* 9.9*  HCT 30.9* 32.2* 33.4* 33.0* 32.7*  MCV 104.0* 103.9* 104.0* 105.1* 106.9*  PLT 203 194 200 192 200    Cardiac Enzymes: No results for input(s): CKTOTAL, CKMB, CKMBINDEX, TROPONINI in the last 168 hours.  BNP (last 3 results) No results for input(s): BNP in the last 8760 hours.  ProBNP (last 3 results) No results for input(s): PROBNP in the last 8760 hours.  Radiological Exams: No results found.  Assessment/Plan Active Problems:   Acute on chronic respiratory failure with hypoxia (HCC)   End stage COPD (HCC)   Pneumonia due to Moraxella catarrhalis (HCC)   Chronic kidney disease, stage III (moderate) (HCC)   1. Acute on chronic respiratory failure with hypoxia we will continue with the pressure support 12/5 weaning as tolerated.  Continue with secretion management pulmonary toilet. 2. End-stage COPD we will continue  with supportive care nebulizers as necessary 3. Pneumonia due to Moraxella catarrhalis has been treated with antibiotics has had multiple rounds 4. Chronic kidney disease stage III following patient's labs closely watch for dehydration   I have personally seen and evaluated the patient, evaluated laboratory and imaging results, formulated the assessment and plan and placed orders. The Patient requires high complexity decision making with multiple systems involvement.  Rounds were done with the Respiratory Therapy Director and Staff therapists and discussed with nursing staff also.  Allyne Gee, MD [facility] Pulmonary Critical Care Medicine Sleep Medicine

## 2021-02-01 DIAGNOSIS — J449 Chronic obstructive pulmonary disease, unspecified: Secondary | ICD-10-CM | POA: Diagnosis present

## 2021-02-01 DIAGNOSIS — J9621 Acute and chronic respiratory failure with hypoxia: Secondary | ICD-10-CM | POA: Diagnosis not present

## 2021-02-01 DIAGNOSIS — N1832 Chronic kidney disease, stage 3b: Secondary | ICD-10-CM | POA: Diagnosis not present

## 2021-02-01 DIAGNOSIS — N183 Chronic kidney disease, stage 3 unspecified: Secondary | ICD-10-CM | POA: Diagnosis present

## 2021-02-01 DIAGNOSIS — J156 Pneumonia due to other aerobic Gram-negative bacteria: Secondary | ICD-10-CM | POA: Diagnosis not present

## 2021-02-01 LAB — CBC
HCT: 32.3 % — ABNORMAL LOW (ref 39.0–52.0)
Hemoglobin: 9.9 g/dL — ABNORMAL LOW (ref 13.0–17.0)
MCH: 32.6 pg (ref 26.0–34.0)
MCHC: 30.7 g/dL (ref 30.0–36.0)
MCV: 106.3 fL — ABNORMAL HIGH (ref 80.0–100.0)
Platelets: 189 10*3/uL (ref 150–400)
RBC: 3.04 MIL/uL — ABNORMAL LOW (ref 4.22–5.81)
RDW: 13.6 % (ref 11.5–15.5)
WBC: 6.8 10*3/uL (ref 4.0–10.5)
nRBC: 0 % (ref 0.0–0.2)

## 2021-02-01 LAB — HEPARIN LEVEL (UNFRACTIONATED): Heparin Unfractionated: 0.34 IU/mL (ref 0.30–0.70)

## 2021-02-01 LAB — HEMOGLOBIN A1C
Hgb A1c MFr Bld: 5.3 % (ref 4.8–5.6)
Mean Plasma Glucose: 105 mg/dL

## 2021-02-01 NOTE — Progress Notes (Signed)
Pulmonary Critical Care Medicine [place]   PULMONARY CRITICAL CARE SERVICE  PROGRESS NOTE     Jackson Hahn  NID:[id number]  DOB: 1961-05-30   DOA: 01/28/2021  Referring Physician: Merton Border, MD  HPI: Jackson Hahn is a 60 y.o. male seen for follow up of Acute on Chronic Respiratory Failure.  Patient currently is on pressure support wean 12/5 for a goal of 12 hours  Medications: Reviewed on Rounds  Physical Exam:  Vitals: Temperature 96.3 pulse 76 respiratory rate is 918 blood pressure 99/42 saturations 94%  Ventilator Settings pressure support FiO2 is 40% pressure of 12/5  . General: Comfortable at this time . Eyes: Grossly normal lids, irises & conjunctiva . ENT: grossly tongue is normal . Neck: no obvious mass . Cardiovascular: S1 S2 normal no gallop . Respiratory: Scattered rhonchi expansion is equal . Abdomen: soft . Skin: no rash seen on limited exam . Musculoskeletal: not rigid . Psychiatric:unable to assess . Neurologic: no seizure no involuntary movements         Lab Data:   Basic Metabolic Panel: Recent Labs  Lab 01/29/21 0529 01/30/21 0028 01/30/21 0039 01/31/21 0357  NA 144 144  --  145  K 4.4 4.2  --  4.2  CL 106 107  --  106  CO2 30 27  --  32  GLUCOSE 105* 116*  --  109*  BUN 55* 52*  --  51*  CREATININE 2.81* 2.73*  --  2.67*  CALCIUM 9.0 9.0  --  9.2  MG 1.9  --  1.9 1.8  PHOS 5.1* 5.5*  --  4.7*    ABG: Recent Labs  Lab 01/28/21 1740  PHART 7.370  PCO2ART 48.8*  PO2ART 89.9  HCO3 27.5  O2SAT 96.6    Liver Function Tests: Recent Labs  Lab 01/29/21 0529 01/30/21 0028 01/31/21 0357  AST 16  --   --   ALT 6  --   --   ALKPHOS 76  --   --   BILITOT 0.7  --   --   PROT 6.6  --   --   ALBUMIN 2.8* 2.8* 2.6*   No results for input(s): LIPASE, AMYLASE in the last 168 hours. No results for input(s): AMMONIA in the last 168 hours.  CBC: Recent Labs  Lab 01/28/21 1735 01/29/21 0529  01/30/21 0039 01/30/21 1755 01/31/21 0357  WBC 6.9 6.5 7.2 7.1 6.3  NEUTROABS  --  4.6  --   --   --   HGB 9.9* 10.2* 10.4* 10.3* 9.9*  HCT 30.9* 32.2* 33.4* 33.0* 32.7*  MCV 104.0* 103.9* 104.0* 105.1* 106.9*  PLT 203 194 200 192 200    Cardiac Enzymes: No results for input(s): CKTOTAL, CKMB, CKMBINDEX, TROPONINI in the last 168 hours.  BNP (last 3 results) No results for input(s): BNP in the last 8760 hours.  ProBNP (last 3 results) No results for input(s): PROBNP in the last 8760 hours.  Radiological Exams: No results found.  Assessment/Plan Active Problems:   Acute on chronic respiratory failure with hypoxia (HCC)   End stage COPD (HCC)   Pneumonia due to Moraxella catarrhalis (HCC)   Chronic kidney disease, stage III (moderate) (HCC)   1. Acute on chronic respiratory failure with hypoxia we will continue with trying to wean on pressure support goal of 12 hours today. 2. End-stage COPD chronic oxygen dependent plan is going to be to continue to monitor patient's respiratory status nebulizers as necessary 3.  Chronic kidney disease stage III supportive care we will continue to monitor along the hydration status 4. Pneumonia due to Moraxella has been treated with antibiotics we will continue with supportive care   I have personally seen and evaluated the patient, evaluated laboratory and imaging results, formulated the assessment and plan and placed orders. The Patient requires high complexity decision making with multiple systems involvement.  Rounds were done with the Respiratory Therapy Director and Staff therapists and discussed with nursing staff also.  Allyne Gee, MD [facility] Pulmonary Critical Care Medicine Sleep Medicine

## 2021-02-02 DIAGNOSIS — N1832 Chronic kidney disease, stage 3b: Secondary | ICD-10-CM | POA: Diagnosis not present

## 2021-02-02 DIAGNOSIS — J9621 Acute and chronic respiratory failure with hypoxia: Secondary | ICD-10-CM | POA: Diagnosis not present

## 2021-02-02 DIAGNOSIS — J449 Chronic obstructive pulmonary disease, unspecified: Secondary | ICD-10-CM | POA: Diagnosis not present

## 2021-02-02 DIAGNOSIS — J156 Pneumonia due to other aerobic Gram-negative bacteria: Secondary | ICD-10-CM | POA: Diagnosis not present

## 2021-02-02 NOTE — Progress Notes (Signed)
Pulmonary Critical Care Medicine [place]   PULMONARY CRITICAL CARE SERVICE  PROGRESS NOTE     DVONTAE RUAN  IRS:[id number]  DOB: [birth year]   DOA: 01/28/2021  Referring Physician: Merton Border, MD  HPI: Jackson Hahn is a 60 y.o. male seen for follow up of Acute on Chronic Respiratory Failure.  Patient currently is on pressure support and has been weaning on 12/5 pressure support.  Medications: Reviewed on Rounds  Physical Exam:  Vitals: Temperature 98.0 pulse 79 respiratory rate is 21 blood pressure is 149/66 saturations 97%  Ventilator Settings on pressure support FiO2 35% pressure 12/5  . General: Comfortable at this time . Eyes: Grossly normal lids, irises & conjunctiva . ENT: grossly tongue is normal . Neck: no obvious mass . Cardiovascular: S1 S2 normal no gallop . Respiratory: No rhonchi no rales are noted at this time . Abdomen: soft . Skin: no rash seen on limited exam . Musculoskeletal: not rigid . Psychiatric:unable to assess . Neurologic: no seizure no involuntary movements         Lab Data:   Basic Metabolic Panel: Recent Labs  Lab 01/29/21 0529 01/30/21 0028 01/30/21 0039 01/31/21 0357  NA 144 144  --  145  K 4.4 4.2  --  4.2  CL 106 107  --  106  CO2 30 27  --  32  GLUCOSE 105* 116*  --  109*  BUN 55* 52*  --  51*  CREATININE 2.81* 2.73*  --  2.67*  CALCIUM 9.0 9.0  --  9.2  MG 1.9  --  1.9 1.8  PHOS 5.1* 5.5*  --  4.7*    ABG: Recent Labs  Lab 01/28/21 1740  PHART 7.370  PCO2ART 48.8*  PO2ART 89.9  HCO3 27.5  O2SAT 96.6    Liver Function Tests: Recent Labs  Lab 01/29/21 0529 01/30/21 0028 01/31/21 0357  AST 16  --   --   ALT 6  --   --   ALKPHOS 76  --   --   BILITOT 0.7  --   --   PROT 6.6  --   --   ALBUMIN 2.8* 2.8* 2.6*   No results for input(s): LIPASE, AMYLASE in the last 168 hours. No results for input(s): AMMONIA in the last 168 hours.  CBC: Recent Labs  Lab 01/29/21 0529  01/30/21 0039 01/30/21 1755 01/31/21 0357 02/01/21 1733  WBC 6.5 7.2 7.1 6.3 6.8  NEUTROABS 4.6  --   --   --   --   HGB 10.2* 10.4* 10.3* 9.9* 9.9*  HCT 32.2* 33.4* 33.0* 32.7* 32.3*  MCV 103.9* 104.0* 105.1* 106.9* 106.3*  PLT 194 200 192 200 189    Cardiac Enzymes: No results for input(s): CKTOTAL, CKMB, CKMBINDEX, TROPONINI in the last 168 hours.  BNP (last 3 results) No results for input(s): BNP in the last 8760 hours.  ProBNP (last 3 results) No results for input(s): PROBNP in the last 8760 hours.  Radiological Exams: No results found.  Assessment/Plan Active Problems:   Acute on chronic respiratory failure with hypoxia (HCC)   End stage COPD (HCC)   Pneumonia due to Moraxella catarrhalis (HCC)   Chronic kidney disease, stage III (moderate) (HCC)   1. Acute on chronic respiratory failure hypoxia we will continue with the pressure support weaning as tolerated. 2. End-stage COPD medical management will need to continue with oxygen therapy 3. Pneumonia due to Moraxella catarrhalis has been treated we will continue  to follow along 4. Chronic kidney disease stage III we will continue with supportive care   I have personally seen and evaluated the patient, evaluated laboratory and imaging results, formulated the assessment and plan and placed orders. The Patient requires high complexity decision making with multiple systems involvement.  Rounds were done with the Respiratory Therapy Director and Staff therapists and discussed with nursing staff also.  Allyne Gee, MD [facility] Pulmonary Critical Care Medicine Sleep Medicine

## 2021-02-03 DIAGNOSIS — J156 Pneumonia due to other aerobic Gram-negative bacteria: Secondary | ICD-10-CM | POA: Diagnosis not present

## 2021-02-03 DIAGNOSIS — J9621 Acute and chronic respiratory failure with hypoxia: Secondary | ICD-10-CM | POA: Diagnosis not present

## 2021-02-03 DIAGNOSIS — N1832 Chronic kidney disease, stage 3b: Secondary | ICD-10-CM | POA: Diagnosis not present

## 2021-02-03 DIAGNOSIS — J449 Chronic obstructive pulmonary disease, unspecified: Secondary | ICD-10-CM | POA: Diagnosis not present

## 2021-02-03 LAB — HEPARIN LEVEL (UNFRACTIONATED): Heparin Unfractionated: 0.31 IU/mL (ref 0.30–0.70)

## 2021-02-03 LAB — CBC
HCT: 32.1 % — ABNORMAL LOW (ref 39.0–52.0)
Hemoglobin: 9.7 g/dL — ABNORMAL LOW (ref 13.0–17.0)
MCH: 32.3 pg (ref 26.0–34.0)
MCHC: 30.2 g/dL (ref 30.0–36.0)
MCV: 107 fL — ABNORMAL HIGH (ref 80.0–100.0)
Platelets: 174 10*3/uL (ref 150–400)
RBC: 3 MIL/uL — ABNORMAL LOW (ref 4.22–5.81)
RDW: 13.4 % (ref 11.5–15.5)
WBC: 6.3 10*3/uL (ref 4.0–10.5)
nRBC: 0 % (ref 0.0–0.2)

## 2021-02-03 NOTE — Progress Notes (Signed)
Pulmonary Critical Care Medicine [name]   PULMONARY CRITICAL CARE SERVICE  PROGRESS NOTE     Jackson Hahn  ATF:[id number]  DOB: [birth year]   DOA: 01/28/2021  Referring Physician: Merton Border, MD  HPI: Jackson Hahn is a 60 y.o. male seen for follow up of Acute on Chronic Respiratory Failure.  Patient has been afebrile right now comfortable without distress remains on T collar has been on 35% FiO2  Medications: Reviewed on Rounds  Physical Exam:  Vitals: Temperature is 98.1 pulse 88 respiratory rate 12 blood pressure is 114/67 saturations 95%  Ventilator Settings on T collar with an FiO2 35%  . General: Comfortable at this time . Eyes: Grossly normal lids, irises & conjunctiva . ENT: grossly tongue is normal . Neck: no obvious mass . Cardiovascular: S1 S2 normal no gallop . Respiratory: No rhonchi no rales are noted at this time . Abdomen: soft . Skin: no rash seen on limited exam . Musculoskeletal: not rigid . Psychiatric:unable to assess . Neurologic: no seizure no involuntary movements         Lab Data:   Basic Metabolic Panel: Recent Labs  Lab 01/29/21 0529 01/30/21 0028 01/30/21 0039 01/31/21 0357  NA 144 144  --  145  K 4.4 4.2  --  4.2  CL 106 107  --  106  CO2 30 27  --  32  GLUCOSE 105* 116*  --  109*  BUN 55* 52*  --  51*  CREATININE 2.81* 2.73*  --  2.67*  CALCIUM 9.0 9.0  --  9.2  MG 1.9  --  1.9 1.8  PHOS 5.1* 5.5*  --  4.7*    ABG: Recent Labs  Lab 01/28/21 1740  PHART 7.370  PCO2ART 48.8*  PO2ART 89.9  HCO3 27.5  O2SAT 96.6    Liver Function Tests: Recent Labs  Lab 01/29/21 0529 01/30/21 0028 01/31/21 0357  AST 16  --   --   ALT 6  --   --   ALKPHOS 76  --   --   BILITOT 0.7  --   --   PROT 6.6  --   --   ALBUMIN 2.8* 2.8* 2.6*   No results for input(s): LIPASE, AMYLASE in the last 168 hours. No results for input(s): AMMONIA in the last 168 hours.  CBC: Recent Labs  Lab 01/29/21 0529  01/30/21 0039 01/30/21 1755 01/31/21 0357 02/01/21 1733  WBC 6.5 7.2 7.1 6.3 6.8  NEUTROABS 4.6  --   --   --   --   HGB 10.2* 10.4* 10.3* 9.9* 9.9*  HCT 32.2* 33.4* 33.0* 32.7* 32.3*  MCV 103.9* 104.0* 105.1* 106.9* 106.3*  PLT 194 200 192 200 189    Cardiac Enzymes: No results for input(s): CKTOTAL, CKMB, CKMBINDEX, TROPONINI in the last 168 hours.  BNP (last 3 results) No results for input(s): BNP in the last 8760 hours.  ProBNP (last 3 results) No results for input(s): PROBNP in the last 8760 hours.  Radiological Exams: No results found.  Assessment/Plan Active Problems:   Acute on chronic respiratory failure with hypoxia (HCC)   End stage COPD (HCC)   Pneumonia due to Moraxella catarrhalis (HCC)   Chronic kidney disease, stage III (moderate) (HCC)   1. Acute on chronic respiratory failure hypoxia we will continue with the wean on T collar goal of 2 hours 2. End-stage COPD needs ongoing oxygen therapy as well as nebulizers as needed 3. Pneumonia due to  Moraxella patient right now is comfortable without distress. 4. Chronic kidney disease stage III we will continue to follow along   I have personally seen and evaluated the patient, evaluated laboratory and imaging results, formulated the assessment and plan and placed orders. The Patient requires high complexity decision making with multiple systems involvement.  Rounds were done with the Respiratory Therapy Director and Staff therapists and discussed with nursing staff also.  Allyne Gee, MD [facility] Pulmonary Critical Care Medicine Sleep Medicine

## 2021-02-04 LAB — BASIC METABOLIC PANEL
Anion gap: 9 (ref 5–15)
BUN: 62 mg/dL — ABNORMAL HIGH (ref 6–20)
CO2: 33 mmol/L — ABNORMAL HIGH (ref 22–32)
Calcium: 9.8 mg/dL (ref 8.9–10.3)
Chloride: 98 mmol/L (ref 98–111)
Creatinine, Ser: 2.67 mg/dL — ABNORMAL HIGH (ref 0.61–1.24)
GFR, Estimated: 27 mL/min — ABNORMAL LOW (ref >60)
Glucose, Bld: 110 mg/dL — ABNORMAL HIGH (ref 70–99)
Potassium: 4.3 mmol/L (ref 3.5–5.1)
Sodium: 140 mmol/L (ref 135–145)

## 2021-02-04 LAB — CBC
HCT: 32.7 % — ABNORMAL LOW (ref 39.0–52.0)
Hemoglobin: 10.1 g/dL — ABNORMAL LOW (ref 13.0–17.0)
MCH: 32.5 pg (ref 26.0–34.0)
MCHC: 30.9 g/dL (ref 30.0–36.0)
MCV: 105.1 fL — ABNORMAL HIGH (ref 80.0–100.0)
Platelets: 172 10*3/uL (ref 150–400)
RBC: 3.11 MIL/uL — ABNORMAL LOW (ref 4.22–5.81)
RDW: 13.4 % (ref 11.5–15.5)
WBC: 6.2 10*3/uL (ref 4.0–10.5)
nRBC: 0 % (ref 0.0–0.2)

## 2021-02-04 LAB — HEPARIN LEVEL (UNFRACTIONATED)
Heparin Unfractionated: 0.23 IU/mL — ABNORMAL LOW (ref 0.30–0.70)
Heparin Unfractionated: 0.71 IU/mL — ABNORMAL HIGH (ref 0.30–0.70)
Heparin Unfractionated: 0.44 IU/mL (ref 0.30–0.70)

## 2021-02-04 LAB — PROTIME-INR
INR: 1.2 (ref 0.8–1.2)
Prothrombin Time: 15.1 seconds (ref 11.4–15.2)

## 2021-02-04 LAB — MAGNESIUM: Magnesium: 2 mg/dL (ref 1.7–2.4)

## 2021-02-05 LAB — CBC
HCT: 32.1 % — ABNORMAL LOW (ref 39.0–52.0)
HCT: 34 % — ABNORMAL LOW (ref 39.0–52.0)
Hemoglobin: 10.7 g/dL — ABNORMAL LOW (ref 13.0–17.0)
Hemoglobin: 9.9 g/dL — ABNORMAL LOW (ref 13.0–17.0)
MCH: 31.9 pg (ref 26.0–34.0)
MCH: 32.3 pg (ref 26.0–34.0)
MCHC: 30.8 g/dL (ref 30.0–36.0)
MCHC: 31.5 g/dL (ref 30.0–36.0)
MCV: 102.7 fL — ABNORMAL HIGH (ref 80.0–100.0)
MCV: 103.5 fL — ABNORMAL HIGH (ref 80.0–100.0)
Platelets: 173 10*3/uL (ref 150–400)
Platelets: 177 10*3/uL (ref 150–400)
RBC: 3.1 MIL/uL — ABNORMAL LOW (ref 4.22–5.81)
RBC: 3.31 MIL/uL — ABNORMAL LOW (ref 4.22–5.81)
RDW: 13.3 % (ref 11.5–15.5)
RDW: 13.4 % (ref 11.5–15.5)
WBC: 6.9 10*3/uL (ref 4.0–10.5)
WBC: 7.7 10*3/uL (ref 4.0–10.5)
nRBC: 0 % (ref 0.0–0.2)
nRBC: 0 % (ref 0.0–0.2)

## 2021-02-05 LAB — HEPARIN LEVEL (UNFRACTIONATED)
Heparin Unfractionated: 0.31 IU/mL (ref 0.30–0.70)
Heparin Unfractionated: 0.41 IU/mL (ref 0.30–0.70)
Heparin Unfractionated: 0.29 IU/mL — ABNORMAL LOW (ref 0.30–0.70)
Heparin Unfractionated: 0.36 IU/mL (ref 0.30–0.70)

## 2021-02-05 LAB — MAGNESIUM: Magnesium: 2 mg/dL (ref 1.7–2.4)

## 2021-02-05 LAB — BASIC METABOLIC PANEL
Anion gap: 7 (ref 5–15)
BUN: 59 mg/dL — ABNORMAL HIGH (ref 6–20)
CO2: 33 mmol/L — ABNORMAL HIGH (ref 22–32)
Calcium: 9.7 mg/dL (ref 8.9–10.3)
Chloride: 101 mmol/L (ref 98–111)
Creatinine, Ser: 2.52 mg/dL — ABNORMAL HIGH (ref 0.61–1.24)
GFR, Estimated: 29 mL/min — ABNORMAL LOW (ref >60)
Glucose, Bld: 115 mg/dL — ABNORMAL HIGH (ref 70–99)
Potassium: 4.6 mmol/L (ref 3.5–5.1)
Sodium: 141 mmol/L (ref 135–145)

## 2021-02-05 LAB — PROTIME-INR
INR: 1.1 (ref 0.8–1.2)
Prothrombin Time: 14.3 seconds (ref 11.4–15.2)

## 2021-02-05 LAB — PHOSPHORUS: Phosphorus: 4.4 mg/dL (ref 2.5–4.6)

## 2021-02-06 ENCOUNTER — Other Ambulatory Visit (HOSPITAL_COMMUNITY): Payer: Medicare Other

## 2021-02-06 LAB — HEPARIN LEVEL (UNFRACTIONATED)
Heparin Unfractionated: 0.29 IU/mL — ABNORMAL LOW (ref 0.30–0.70)
Heparin Unfractionated: 0.2 IU/mL — ABNORMAL LOW (ref 0.30–0.70)
Heparin Unfractionated: 0.46 IU/mL (ref 0.30–0.70)

## 2021-02-06 LAB — PROTIME-INR
INR: 1.1 (ref 0.8–1.2)
Prothrombin Time: 13.8 seconds (ref 11.4–15.2)

## 2021-02-06 NOTE — Progress Notes (Signed)
PULMONARY CRITICAL CARE SERVICE  PROGRESS NOTE   Jackson Hahn  YHC:[id number]  DOB: 09/28/1960   DOA: 01/28/2021  Referring Physician: Merton Border, MD  HPI: Jackson Hahn is a 60 y.o. male seen for follow up of Acute on Chronic Respiratory Failure.  Patient currently is on T collar has been on 35% FiO2 using PMV  Medications: Reviewed on Rounds  Physical Exam:  Vitals: Temperature is 97.8 pulse 79 respiratory 16 blood pressure is 126/65 saturations 96%  Ventilator Settings on T collar FiO2 35%  . General: Comfortable at this time . ENT: grossly unremarkable . Neck: no obvious mass . Cardiovascular: no malignant arrhythmias . Respiratory: No rhonchi no rales are noted . Abdomen: soft . Skin: no rash seen on limited exam . Musculoskeletal: not rigid . Psychiatric:unable to assess . Neurologic: no seizure no involuntary movements         Lab Data:   Basic Metabolic Panel: Recent Labs  Lab 01/31/21 0357 02/04/21 0529 02/05/21 0018  NA 145 140 141  K 4.2 4.3 4.6  CL 106 98 101  CO2 32 33* 33*  GLUCOSE 109* 110* 115*  BUN 51* 62* 59*  CREATININE 2.67* 2.67* 2.52*  CALCIUM 9.2 9.8 9.7  MG 1.8 2.0 2.0  PHOS 4.7*  --  4.4    ABG: No results for input(s): PHART, PCO2ART, PO2ART, HCO3, O2SAT in the last 168 hours.  Liver Function Tests: Recent Labs  Lab 01/31/21 0357  ALBUMIN 2.6*   No results for input(s): LIPASE, AMYLASE in the last 168 hours. No results for input(s): AMMONIA in the last 168 hours.  CBC: Recent Labs  Lab 02/01/21 1733 02/03/21 1701 02/04/21 0529 02/05/21 0018 02/05/21 1635  WBC 6.8 6.3 6.2 6.9 7.7  HGB 9.9* 9.7* 10.1* 9.9* 10.7*  HCT 32.3* 32.1* 32.7* 32.1* 34.0*  MCV 106.3* 107.0* 105.1* 103.5* 102.7*  PLT 189 174 172 177 173    Cardiac Enzymes: No results for input(s): CKTOTAL, CKMB, CKMBINDEX, TROPONINI in the last 168 hours.  BNP (last 3 results) No results for input(s): BNP in the last 8760 hours.  ProBNP (last 3  results) No results for input(s): PROBNP in the last 8760 hours.  Radiological Exams: No results found.  Assessment/Plan Active Problems:   Acute on chronic respiratory failure with hypoxia (HCC)   End stage COPD (HCC)   Pneumonia due to Moraxella catarrhalis (HCC)   Chronic kidney disease, stage III (moderate) (HCC)   1. Acute on chronic respiratory failure with hypoxia plan is to continue with T collar trials titrate oxygen as tolerated continue pulmonary toilet. 2. End-stage COPD supportive care we will continue to monitor 3. Pneumonia due to Moraxella catarrhalis patient's baseline 4. Chronic kidney disease stage III we will continue with supportive care   I have personally seen and evaluated the patient, evaluated laboratory and imaging results, formulated the assessment and plan and placed orders. The Patient requires high complexity decision making with multiple systems involvement.  Rounds were done with the Respiratory Therapy Director and Staff therapists and discussed with nursing staff also.  Allyne Gee, MD [facility] Pulmonary Critical Care Medicine Sleep Medicine

## 2021-02-07 LAB — RENAL FUNCTION PANEL
Albumin: 2.6 g/dL — ABNORMAL LOW (ref 3.5–5.0)
Anion gap: 7 (ref 5–15)
BUN: 61 mg/dL — ABNORMAL HIGH (ref 6–20)
CO2: 30 mmol/L (ref 22–32)
Calcium: 9.9 mg/dL (ref 8.9–10.3)
Chloride: 101 mmol/L (ref 98–111)
Creatinine, Ser: 2.65 mg/dL — ABNORMAL HIGH (ref 0.61–1.24)
GFR, Estimated: 27 mL/min — ABNORMAL LOW (ref >60)
Glucose, Bld: 130 mg/dL — ABNORMAL HIGH (ref 70–99)
Phosphorus: 4.7 mg/dL — ABNORMAL HIGH (ref 2.5–4.6)
Potassium: 4.3 mmol/L (ref 3.5–5.1)
Sodium: 138 mmol/L (ref 135–145)

## 2021-02-07 LAB — CBC
HCT: 32.1 % — ABNORMAL LOW (ref 39.0–52.0)
Hemoglobin: 10.4 g/dL — ABNORMAL LOW (ref 13.0–17.0)
MCH: 32.7 pg (ref 26.0–34.0)
MCHC: 32.4 g/dL (ref 30.0–36.0)
MCV: 100.9 fL — ABNORMAL HIGH (ref 80.0–100.0)
Platelets: 145 10*3/uL — ABNORMAL LOW (ref 150–400)
RBC: 3.18 MIL/uL — ABNORMAL LOW (ref 4.22–5.81)
RDW: 13.3 % (ref 11.5–15.5)
WBC: 7.6 10*3/uL (ref 4.0–10.5)
nRBC: 0 % (ref 0.0–0.2)

## 2021-02-07 LAB — HEPARIN LEVEL (UNFRACTIONATED)
Heparin Unfractionated: 0.21 IU/mL — ABNORMAL LOW (ref 0.30–0.70)
Heparin Unfractionated: 0.48 IU/mL (ref 0.30–0.70)
Heparin Unfractionated: 0.28 IU/mL — ABNORMAL LOW (ref 0.30–0.70)

## 2021-02-07 LAB — PROTIME-INR
INR: 1.1 (ref 0.8–1.2)
Prothrombin Time: 14.7 seconds (ref 11.4–15.2)

## 2021-02-07 LAB — MAGNESIUM: Magnesium: 2 mg/dL (ref 1.7–2.4)

## 2021-02-07 NOTE — Progress Notes (Signed)
Pulmonary Critical Care Medicine [place]   PULMONARY CRITICAL CARE SERVICE  PROGRESS NOTE     HEINZ ECKERT  JJO:[id number]  DOB: 1961-06-05   DOA: 01/28/2021  Referring Physician: Merton Border, MD  HPI: Jackson SOLEY is a 60 y.o. male seen for follow up of Acute on Chronic Respiratory Failure.  Patient currently is on T collar has been on 35% FiO2  Medications: Reviewed on Rounds  Physical Exam:  Vitals: Temperature is 97.6 pulse 75 respiratory 16 blood pressure is 114/60 saturations 94%  Ventilator Settings off ventilator on T collar FiO2 35%  . General: Comfortable at this time . ENT: grossly unremarkable . Neck: no obvious mass . Cardiovascular: no malignant arrhythmias . Respiratory: No rhonchi very coarse breath sounds . Abdomen: soft . Skin: no rash seen on limited exam . Musculoskeletal: not rigid . Psychiatric:unable to assess . Neurologic: no seizure no involuntary movements         Lab Data:   Basic Metabolic Panel: Recent Labs  Lab 02/04/21 0529 02/05/21 0018 02/07/21 0354  NA 140 141 138  K 4.3 4.6 4.3  CL 98 101 101  CO2 33* 33* 30  GLUCOSE 110* 115* 130*  BUN 62* 59* 61*  CREATININE 2.67* 2.52* 2.65*  CALCIUM 9.8 9.7 9.9  MG 2.0 2.0 2.0  PHOS  --  4.4 4.7*    ABG: No results for input(s): PHART, PCO2ART, PO2ART, HCO3, O2SAT in the last 168 hours.  Liver Function Tests: Recent Labs  Lab 02/07/21 0354  ALBUMIN 2.6*   No results for input(s): LIPASE, AMYLASE in the last 168 hours. No results for input(s): AMMONIA in the last 168 hours.  CBC: Recent Labs  Lab 02/03/21 1701 02/04/21 0529 02/05/21 0018 02/05/21 1635 02/07/21 0354  WBC 6.3 6.2 6.9 7.7 7.6  HGB 9.7* 10.1* 9.9* 10.7* 10.4*  HCT 32.1* 32.7* 32.1* 34.0* 32.1*  MCV 107.0* 105.1* 103.5* 102.7* 100.9*  PLT 174 172 177 173 145*    Cardiac Enzymes: No results for input(s): CKTOTAL, CKMB, CKMBINDEX, TROPONINI in the last 168 hours.  BNP  (last 3 results) No results for input(s): BNP in the last 8760 hours.  ProBNP (last 3 results) No results for input(s): PROBNP in the last 8760 hours.  Radiological Exams: DG Abd Portable 1V  Result Date: 02/06/2021 CLINICAL DATA:  Fecal impaction EXAM: PORTABLE ABDOMEN - 1 VIEW COMPARISON:  01/28/2021 FINDINGS: Nonobstructive pattern of bowel gas. Scattered stool throughout left and right colon. Contrast material in the rectum. Percutaneous gastrostomy tube. No obvious free air. IMPRESSION: Nonobstructive pattern of bowel gas. Scattered stool throughout left and right colon. Contrast material in the rectum. Electronically Signed   By: Eddie Candle M.D.   On: 02/06/2021 12:07    Assessment/Plan Active Problems:   Acute on chronic respiratory failure with hypoxia (HCC)   End stage COPD (HCC)   Pneumonia due to Moraxella catarrhalis (HCC)   Chronic kidney disease, stage III (moderate) (HCC)   1. Acute on chronic respiratory failure hypoxia we will continue to wean on T collar patient's goal is for 20 hours. 2. End-stage COPD supportive care we will continue to follow along. 3. Pneumonia due to Moraxella has been treated we will continue to monitor 4. Chronic kidney disease stage III supportive care   I have personally seen and evaluated the patient, evaluated laboratory and imaging results, formulated the assessment and plan and placed orders. The Patient requires high complexity decision making with multiple systems involvement.  Rounds were done with the Respiratory Therapy Director and Staff therapists and discussed with nursing staff also.  Allyne Gee, MD [facility] Pulmonary Critical Care Medicine Sleep Medicine

## 2021-02-08 LAB — PROTIME-INR
INR: 1.2 (ref 0.8–1.2)
Prothrombin Time: 14.9 seconds (ref 11.4–15.2)

## 2021-02-08 LAB — HEPARIN LEVEL (UNFRACTIONATED)
Heparin Unfractionated: 0.24 IU/mL — ABNORMAL LOW (ref 0.30–0.70)
Heparin Unfractionated: 0.54 IU/mL (ref 0.30–0.70)
Heparin Unfractionated: 0.34 IU/mL (ref 0.30–0.70)

## 2021-02-09 LAB — BLOOD GAS, ARTERIAL
Acid-Base Excess: 6.5 mmol/L — ABNORMAL HIGH (ref 0.0–2.0)
Bicarbonate: 31.7 mmol/L — ABNORMAL HIGH (ref 20.0–28.0)
FIO2: 35
O2 Saturation: 93.1 %
Patient temperature: 36.3
pCO2 arterial: 53.9 mmHg — ABNORMAL HIGH (ref 32.0–48.0)
pH, Arterial: 7.383 (ref 7.350–7.450)
pO2, Arterial: 67 mmHg — ABNORMAL LOW (ref 83.0–108.0)

## 2021-02-09 LAB — BASIC METABOLIC PANEL
Anion gap: 7 (ref 5–15)
BUN: 60 mg/dL — ABNORMAL HIGH (ref 6–20)
CO2: 34 mmol/L — ABNORMAL HIGH (ref 22–32)
Calcium: 10.1 mg/dL (ref 8.9–10.3)
Chloride: 98 mmol/L (ref 98–111)
Creatinine, Ser: 2.73 mg/dL — ABNORMAL HIGH (ref 0.61–1.24)
GFR, Estimated: 26 mL/min — ABNORMAL LOW (ref >60)
Glucose, Bld: 90 mg/dL (ref 70–99)
Potassium: 4.8 mmol/L (ref 3.5–5.1)
Sodium: 139 mmol/L (ref 135–145)

## 2021-02-09 LAB — PROTIME-INR
INR: 1.3 — ABNORMAL HIGH (ref 0.8–1.2)
Prothrombin Time: 15.8 seconds — ABNORMAL HIGH (ref 11.4–15.2)

## 2021-02-09 LAB — CBC
HCT: 34.8 % — ABNORMAL LOW (ref 39.0–52.0)
Hemoglobin: 11.2 g/dL — ABNORMAL LOW (ref 13.0–17.0)
MCH: 32.7 pg (ref 26.0–34.0)
MCHC: 32.2 g/dL (ref 30.0–36.0)
MCV: 101.8 fL — ABNORMAL HIGH (ref 80.0–100.0)
Platelets: 164 10*3/uL (ref 150–400)
RBC: 3.42 MIL/uL — ABNORMAL LOW (ref 4.22–5.81)
RDW: 13.3 % (ref 11.5–15.5)
WBC: 8.9 10*3/uL (ref 4.0–10.5)
nRBC: 0 % (ref 0.0–0.2)

## 2021-02-09 LAB — MAGNESIUM: Magnesium: 2.1 mg/dL (ref 1.7–2.4)

## 2021-02-09 LAB — HEPARIN LEVEL (UNFRACTIONATED)
Heparin Unfractionated: 0.37 IU/mL (ref 0.30–0.70)
Heparin Unfractionated: 0.18 IU/mL — ABNORMAL LOW (ref 0.30–0.70)
Heparin Unfractionated: 0.43 IU/mL (ref 0.30–0.70)

## 2021-02-09 LAB — PHOSPHORUS: Phosphorus: 4.7 mg/dL — ABNORMAL HIGH (ref 2.5–4.6)

## 2021-02-10 LAB — URINALYSIS, ROUTINE W REFLEX MICROSCOPIC
Bilirubin Urine: NEGATIVE
Glucose, UA: NEGATIVE mg/dL
Ketones, ur: NEGATIVE mg/dL
Nitrite: NEGATIVE
Protein, ur: 30 mg/dL — AB
Specific Gravity, Urine: 1.01 (ref 1.005–1.030)
WBC, UA: >50 WBC/hpf — ABNORMAL HIGH (ref 0–5)
pH: 6 (ref 5.0–8.0)

## 2021-02-10 LAB — PROTIME-INR
INR: 1.3 — ABNORMAL HIGH (ref 0.8–1.2)
Prothrombin Time: 16.3 seconds — ABNORMAL HIGH (ref 11.4–15.2)

## 2021-02-10 LAB — HEPARIN LEVEL (UNFRACTIONATED)
Heparin Unfractionated: <0.1 IU/mL — ABNORMAL LOW (ref 0.30–0.70)
Heparin Unfractionated: 0.34 IU/mL (ref 0.30–0.70)
Heparin Unfractionated: 0.46 IU/mL (ref 0.30–0.70)

## 2021-02-11 LAB — BASIC METABOLIC PANEL
Anion gap: 6 (ref 5–15)
BUN: 68 mg/dL — ABNORMAL HIGH (ref 6–20)
CO2: 33 mmol/L — ABNORMAL HIGH (ref 22–32)
Calcium: 10 mg/dL (ref 8.9–10.3)
Chloride: 98 mmol/L (ref 98–111)
Creatinine, Ser: 2.92 mg/dL — ABNORMAL HIGH (ref 0.61–1.24)
GFR, Estimated: 24 mL/min — ABNORMAL LOW (ref >60)
Glucose, Bld: 121 mg/dL — ABNORMAL HIGH (ref 70–99)
Potassium: 4.6 mmol/L (ref 3.5–5.1)
Sodium: 137 mmol/L (ref 135–145)

## 2021-02-11 LAB — CBC
HCT: 33.7 % — ABNORMAL LOW (ref 39.0–52.0)
Hemoglobin: 10.6 g/dL — ABNORMAL LOW (ref 13.0–17.0)
MCH: 31.9 pg (ref 26.0–34.0)
MCHC: 31.5 g/dL (ref 30.0–36.0)
MCV: 101.5 fL — ABNORMAL HIGH (ref 80.0–100.0)
Platelets: 158 10*3/uL (ref 150–400)
RBC: 3.32 MIL/uL — ABNORMAL LOW (ref 4.22–5.81)
RDW: 13.6 % (ref 11.5–15.5)
WBC: 10.8 10*3/uL — ABNORMAL HIGH (ref 4.0–10.5)
nRBC: 0 % (ref 0.0–0.2)

## 2021-02-11 LAB — HEPARIN LEVEL (UNFRACTIONATED)
Heparin Unfractionated: 0.38 IU/mL (ref 0.30–0.70)
Heparin Unfractionated: 0.35 IU/mL (ref 0.30–0.70)
Heparin Unfractionated: 0.1 IU/mL — ABNORMAL LOW (ref 0.30–0.70)
Heparin Unfractionated: 0.32 IU/mL (ref 0.30–0.70)

## 2021-02-11 LAB — MAGNESIUM: Magnesium: 2.3 mg/dL (ref 1.7–2.4)

## 2021-02-11 LAB — PROTIME-INR
INR: 1.4 — ABNORMAL HIGH (ref 0.8–1.2)
Prothrombin Time: 17 seconds — ABNORMAL HIGH (ref 11.4–15.2)

## 2021-02-11 LAB — PHOSPHORUS: Phosphorus: 5.5 mg/dL — ABNORMAL HIGH (ref 2.5–4.6)

## 2021-02-12 LAB — CULTURE, RESPIRATORY W GRAM STAIN: Culture: NORMAL

## 2021-02-12 LAB — URINE CULTURE: Culture: NO GROWTH

## 2021-02-12 LAB — HEPARIN LEVEL (UNFRACTIONATED)
Heparin Unfractionated: 0.41 IU/mL (ref 0.30–0.70)
Heparin Unfractionated: 0.31 IU/mL (ref 0.30–0.70)
Heparin Unfractionated: 0.3 IU/mL (ref 0.30–0.70)

## 2021-02-12 LAB — PROTIME-INR
INR: 1.5 — ABNORMAL HIGH (ref 0.8–1.2)
Prothrombin Time: 18 seconds — ABNORMAL HIGH (ref 11.4–15.2)

## 2021-02-13 LAB — PROTIME-INR
INR: 1.5 — ABNORMAL HIGH (ref 0.8–1.2)
Prothrombin Time: 18.1 seconds — ABNORMAL HIGH (ref 11.4–15.2)

## 2021-02-13 LAB — HEPARIN LEVEL (UNFRACTIONATED): Heparin Unfractionated: 0.28 IU/mL — ABNORMAL LOW (ref 0.30–0.70)

## 2021-02-14 ENCOUNTER — Other Ambulatory Visit (HOSPITAL_COMMUNITY): Payer: Medicare Other

## 2021-02-14 LAB — CBC
HCT: 34.8 % — ABNORMAL LOW (ref 39.0–52.0)
Hemoglobin: 11.2 g/dL — ABNORMAL LOW (ref 13.0–17.0)
MCH: 32.6 pg (ref 26.0–34.0)
MCHC: 32.2 g/dL (ref 30.0–36.0)
MCV: 101.2 fL — ABNORMAL HIGH (ref 80.0–100.0)
Platelets: 192 10*3/uL (ref 150–400)
RBC: 3.44 MIL/uL — ABNORMAL LOW (ref 4.22–5.81)
RDW: 13.8 % (ref 11.5–15.5)
WBC: 9.2 10*3/uL (ref 4.0–10.5)
nRBC: 0 % (ref 0.0–0.2)

## 2021-02-14 LAB — RENAL FUNCTION PANEL
Albumin: 3.1 g/dL — ABNORMAL LOW (ref 3.5–5.0)
Anion gap: 6 (ref 5–15)
BUN: 73 mg/dL — ABNORMAL HIGH (ref 6–20)
CO2: 33 mmol/L — ABNORMAL HIGH (ref 22–32)
Calcium: 11 mg/dL — ABNORMAL HIGH (ref 8.9–10.3)
Chloride: 97 mmol/L — ABNORMAL LOW (ref 98–111)
Creatinine, Ser: 2.79 mg/dL — ABNORMAL HIGH (ref 0.61–1.24)
GFR, Estimated: 25 mL/min — ABNORMAL LOW (ref >60)
Glucose, Bld: 100 mg/dL — ABNORMAL HIGH (ref 70–99)
Phosphorus: 5.4 mg/dL — ABNORMAL HIGH (ref 2.5–4.6)
Potassium: 4.9 mmol/L (ref 3.5–5.1)
Sodium: 136 mmol/L (ref 135–145)

## 2021-02-14 LAB — HEPARIN LEVEL (UNFRACTIONATED)
Heparin Unfractionated: 0.18 IU/mL — ABNORMAL LOW (ref 0.30–0.70)
Heparin Unfractionated: 0.47 IU/mL (ref 0.30–0.70)

## 2021-02-14 LAB — MAGNESIUM: Magnesium: 2.5 mg/dL — ABNORMAL HIGH (ref 1.7–2.4)

## 2021-02-14 LAB — PROTIME-INR
INR: 1.5 — ABNORMAL HIGH (ref 0.8–1.2)
Prothrombin Time: 18.5 seconds — ABNORMAL HIGH (ref 11.4–15.2)

## 2021-02-14 NOTE — Progress Notes (Signed)
Pulmonary Critical Care Medicine [place]   PULMONARY CRITICAL CARE SERVICE  PROGRESS NOTE     Jackson Hahn  JOI:[id number]  DOB: 03-22-1961   DOA: 01/28/2021  Referring Physician: Merton Border, MD  HPI: Jackson Hahn is a 60 y.o. male seen for follow up of Acute on Chronic Respiratory Failure.  Patient is off the ventilator liberated right now is on T collar requiring 35% FiO2  Medications: Reviewed on Rounds  Physical Exam:  Vitals: Temperature is 96.8 pulse 79 respiratory to 16 blood pressure is 126/61 saturations 95%  Ventilator Settings on T collar FiO2 35%  General: Comfortable at this time ENT: grossly unremarkable Neck: no obvious mass Cardiovascular: no malignant arrhythmias Respiratory: Scattered rhonchi expansion is equal Abdomen: soft Skin: no rash seen on limited exam Musculoskeletal: not rigid Psychiatric:unable to assess Neurologic: no seizure no involuntary movements         Lab Data:   Basic Metabolic Panel: Recent Labs  Lab 02/09/21 0422 02/11/21 0137  NA 139 137  K 4.8 4.6  CL 98 98  CO2 34* 33*  GLUCOSE 90 121*  BUN 60* 68*  CREATININE 2.73* 2.92*  CALCIUM 10.1 10.0  MG 2.1 2.3  PHOS 4.7* 5.5*    ABG: Recent Labs  Lab 02/09/21 1210  PHART 7.383  PCO2ART 53.9*  PO2ART 67.0*  HCO3 31.7*  O2SAT 93.1    Liver Function Tests: No results for input(s): AST, ALT, ALKPHOS, BILITOT, PROT, ALBUMIN in the last 168 hours. No results for input(s): LIPASE, AMYLASE in the last 168 hours. No results for input(s): AMMONIA in the last 168 hours.  CBC: Recent Labs  Lab 02/09/21 0422 02/11/21 0137  WBC 8.9 10.8*  HGB 11.2* 10.6*  HCT 34.8* 33.7*  MCV 101.8* 101.5*  PLT 164 158    Cardiac Enzymes: No results for input(s): CKTOTAL, CKMB, CKMBINDEX, TROPONINI in the last 168 hours.  BNP (last 3 results) No results for input(s): BNP in the last 8760 hours.  ProBNP (last 3 results) No results for input(s):  PROBNP in the last 8760 hours.  Radiological Exams: No results found.  Assessment/Plan Active Problems:   Acute on chronic respiratory failure with hypoxia (HCC)   End stage COPD (HCC)   Pneumonia due to Moraxella catarrhalis (HCC)   Chronic kidney disease, stage III (moderate) (HCC)   Acute on chronic respiratory failure hypoxia continue with the T collar trials patient remains on 35% FiO2 we will continue to follow along closely End-stage COPD continue with medical management Pneumonia due to Moraxella has been treated with antibiotics Chronic kidney disease stage III we will continue with supportive care   I have personally seen and evaluated the patient, evaluated laboratory and imaging results, formulated the assessment and plan and placed orders. The Patient requires high complexity decision making with multiple systems involvement.  Rounds were done with the Respiratory Therapy Director and Staff therapists and discussed with nursing staff also.  Allyne Gee, MD [facility] Pulmonary Critical Care Medicine Sleep Medicine

## 2021-02-15 LAB — HEPARIN LEVEL (UNFRACTIONATED)
Heparin Unfractionated: 0.36 IU/mL (ref 0.30–0.70)
Heparin Unfractionated: 0.24 IU/mL — ABNORMAL LOW (ref 0.30–0.70)
Heparin Unfractionated: 0.36 IU/mL (ref 0.30–0.70)

## 2021-02-15 LAB — PROTIME-INR
INR: 1.6 — ABNORMAL HIGH (ref 0.8–1.2)
Prothrombin Time: 19.2 seconds — ABNORMAL HIGH (ref 11.4–15.2)

## 2021-02-15 NOTE — Progress Notes (Signed)
Pulmonary Critical Care Medicine [place]   PULMONARY CRITICAL CARE SERVICE  PROGRESS NOTE     Jackson Hahn  [id number]  DOB: 1961-04-09   DOA: 01/28/2021  Referring Physician: Merton Border, MD  HPI: Jackson Hahn is a 60 y.o. male being followed for ventilator/airway/oxygen weaning Acute on Chronic Respiratory Failure.  Patient currently is on T collar has been using the PMV  Medications: Reviewed on Rounds  Physical Exam:  Vitals: Temperature is 97.2 pulse 94 respiratory 27 blood pressure is 123/73 saturations 92%  Ventilator Settings off the ventilator right now on T collar  General: Comfortable at this time Neck: supple Cardiovascular: no malignant arrhythmias Respiratory: No rhonchi very coarse breath sounds Skin: no rash seen on limited exam Musculoskeletal: No gross abnormality Psychiatric:unable to assess Neurologic:no involuntary movements         Lab Data:   Basic Metabolic Panel: Recent Labs  Lab 02/09/21 0422 02/11/21 0137 02/14/21 0812  NA 139 137 136  K 4.8 4.6 4.9  CL 98 98 97*  CO2 34* 33* 33*  GLUCOSE 90 121* 100*  BUN 60* 68* 73*  CREATININE 2.73* 2.92* 2.79*  CALCIUM 10.1 10.0 11.0*  MG 2.1 2.3 2.5*  PHOS 4.7* 5.5* 5.4*    ABG: Recent Labs  Lab 02/09/21 1210  PHART 7.383  PCO2ART 53.9*  PO2ART 67.0*  HCO3 31.7*  O2SAT 93.1    Liver Function Tests: Recent Labs  Lab 02/14/21 0812  ALBUMIN 3.1*   No results for input(s): LIPASE, AMYLASE in the last 168 hours. No results for input(s): AMMONIA in the last 168 hours.  CBC: Recent Labs  Lab 02/09/21 0422 02/11/21 0137 02/14/21 0812  WBC 8.9 10.8* 9.2  HGB 11.2* 10.6* 11.2*  HCT 34.8* 33.7* 34.8*  MCV 101.8* 101.5* 101.2*  PLT 164 158 192    Cardiac Enzymes: No results for input(s): CKTOTAL, CKMB, CKMBINDEX, TROPONINI in the last 168 hours.  BNP (last 3 results) No results for input(s): BNP in the last 8760 hours.  ProBNP (last 3  results) No results for input(s): PROBNP in the last 8760 hours.  Radiological Exams: No results found.  Assessment/Plan Active Problems:   Acute on chronic respiratory failure with hypoxia (HCC)   End stage COPD (HCC)   Pneumonia due to Moraxella catarrhalis (HCC)   Chronic kidney disease, stage III (moderate) (HCC)   Acute on chronic respiratory failure with hypoxia plan is going to continue with the T-piece use the PMV as tolerated End-stage COPD medical management we will continue to follow along Moraxella catarrhalis pneumonia treated slow improvement Chronic kidney disease stage III continue with supportive care   I have personally seen and evaluated the patient, evaluated laboratory and imaging results, formulated the assessment and plan and placed orders. The Patient requires high complexity decision making with multiple systems involvement.  Rounds were done with the Respiratory Therapy Director and Staff therapists and discussed with nursing staff also.  Allyne Gee, MD [facility] Pulmonary Critical Care Medicine Sleep Medicine

## 2021-02-16 LAB — HEPARIN LEVEL (UNFRACTIONATED)
Heparin Unfractionated: 0.16 IU/mL — ABNORMAL LOW (ref 0.30–0.70)
Heparin Unfractionated: 0.33 IU/mL (ref 0.30–0.70)
Heparin Unfractionated: 0.2 IU/mL — ABNORMAL LOW (ref 0.30–0.70)

## 2021-02-16 LAB — PROTIME-INR
INR: 1.9 — ABNORMAL HIGH (ref 0.8–1.2)
Prothrombin Time: 22 seconds — ABNORMAL HIGH (ref 11.4–15.2)

## 2021-02-16 NOTE — Progress Notes (Signed)
Pulmonary Critical Care Medicine [place]   PULMONARY CRITICAL CARE SERVICE  PROGRESS NOTE     Jackson Hahn  UTM:[id number]  DOB: 15-Sep-1960   DOA: 01/28/2021  Referring Physician: Merton Border, MD  HPI: Jackson Hahn is a 60 y.o. male being followed for ventilator/airway/oxygen weaning Acute on Chronic Respiratory Failure.  Patient is resting comfortably right now without distress at this time remains on T collar unfortunately was on 50% FiO2  Medications: Reviewed on Rounds  Physical Exam:  Vitals: Temperature is 97.2 pulse 90 respiratory rate is 10 blood pressure is 123/64 saturations 95%  Ventilator Settings off the ventilator on T collar FiO2 50%  General: Comfortable at this time Neck: supple Cardiovascular: no malignant arrhythmias Respiratory: Scattered rhonchi expansion is a Skin: no rash seen on limited exam Musculoskeletal: No gross abnormality Psychiatric:unable to assess Neurologic:no involuntary movements         Lab Data:   Basic Metabolic Panel: Recent Labs  Lab 02/11/21 0137 02/14/21 0812  NA 137 136  K 4.6 4.9  CL 98 97*  CO2 33* 33*  GLUCOSE 121* 100*  BUN 68* 73*  CREATININE 2.92* 2.79*  CALCIUM 10.0 11.0*  MG 2.3 2.5*  PHOS 5.5* 5.4*    ABG: Recent Labs  Lab 02/09/21 1210  PHART 7.383  PCO2ART 53.9*  PO2ART 67.0*  HCO3 31.7*  O2SAT 93.1    Liver Function Tests: Recent Labs  Lab 02/14/21 0812  ALBUMIN 3.1*   No results for input(s): LIPASE, AMYLASE in the last 168 hours. No results for input(s): AMMONIA in the last 168 hours.  CBC: Recent Labs  Lab 02/11/21 0137 02/14/21 0812  WBC 10.8* 9.2  HGB 10.6* 11.2*  HCT 33.7* 34.8*  MCV 101.5* 101.2*  PLT 158 192    Cardiac Enzymes: No results for input(s): CKTOTAL, CKMB, CKMBINDEX, TROPONINI in the last 168 hours.  BNP (last 3 results) No results for input(s): BNP in the last 8760 hours.  ProBNP (last 3 results) No results for  input(s): PROBNP in the last 8760 hours.  Radiological Exams: No results found.  Assessment/Plan Active Problems:   Acute on chronic respiratory failure with hypoxia (HCC)   End stage COPD (HCC)   Pneumonia due to Moraxella catarrhalis (HCC)   Chronic kidney disease, stage III (moderate) (HCC)   Acute on chronic respiratory failure hypoxia plan is to continue with the T-piece patient right now is on 50% FiO2 good saturations are noted. End-stage COPD we will continue with medical management Pneumonia due to Moraxella continue to follow along closely Chronic kidney disease stage III supportive care   I have personally seen and evaluated the patient, evaluated laboratory and imaging results, formulated the assessment and plan and placed orders. The Patient requires high complexity decision making with multiple systems involvement.  Rounds were done with the Respiratory Therapy Director and Staff therapists and discussed with nursing staff also.  Allyne Gee, MD [facility] [place] Pulmonary Critical Care Medicine Sleep Medicine

## 2021-02-17 LAB — HEPARIN LEVEL (UNFRACTIONATED)
Heparin Unfractionated: 0.18 IU/mL — ABNORMAL LOW (ref 0.30–0.70)
Heparin Unfractionated: 0.41 IU/mL (ref 0.30–0.70)
Heparin Unfractionated: 0.29 IU/mL — ABNORMAL LOW (ref 0.30–0.70)
Heparin Unfractionated: 0.22 IU/mL — ABNORMAL LOW (ref 0.30–0.70)

## 2021-02-17 LAB — PROTIME-INR
INR: 2.2 — ABNORMAL HIGH (ref 0.8–1.2)
Prothrombin Time: 24.7 seconds — ABNORMAL HIGH (ref 11.4–15.2)

## 2021-02-17 NOTE — Progress Notes (Signed)
Pulmonary Critical Care Medicine [place]   PULMONARY CRITICAL CARE SERVICE  PROGRESS NOTE     EFTON THOMLEY  [id number]  DOB: June 23, 1961   DOA: 01/28/2021  Referring Physician: Merton Border, MD  HPI: VANNAK MONTENEGRO is a 60 y.o. male being followed for ventilator/airway/oxygen weaning Acute on Chronic Respiratory Failure.  Patient is comfortable right now without distress has been off of the ventilator doing well with the T collar  Medications: Reviewed on Rounds  Physical Exam:  Vitals: Temperature is 98.5 pulse 89 respiratory 23 blood pressure is 117/52 saturations 100%  Ventilator Settings on T collar FiO2 40%  General: Comfortable at this time Neck: supple Cardiovascular: no malignant arrhythmias Respiratory: No rhonchi no rales noted at this time Skin: no rash seen on limited exam Musculoskeletal: No gross abnormality Psychiatric:unable to assess Neurologic:no involuntary movements         Lab Data:   Basic Metabolic Panel: Recent Labs  Lab 02/11/21 0137 02/14/21 0812  NA 137 136  K 4.6 4.9  CL 98 97*  CO2 33* 33*  GLUCOSE 121* 100*  BUN 68* 73*  CREATININE 2.92* 2.79*  CALCIUM 10.0 11.0*  MG 2.3 2.5*  PHOS 5.5* 5.4*    ABG: No results for input(s): PHART, PCO2ART, PO2ART, HCO3, O2SAT in the last 168 hours.  Liver Function Tests: Recent Labs  Lab 02/14/21 0812  ALBUMIN 3.1*   No results for input(s): LIPASE, AMYLASE in the last 168 hours. No results for input(s): AMMONIA in the last 168 hours.  CBC: Recent Labs  Lab 02/11/21 0137 02/14/21 0812  WBC 10.8* 9.2  HGB 10.6* 11.2*  HCT 33.7* 34.8*  MCV 101.5* 101.2*  PLT 158 192    Cardiac Enzymes: No results for input(s): CKTOTAL, CKMB, CKMBINDEX, TROPONINI in the last 168 hours.  BNP (last 3 results) No results for input(s): BNP in the last 8760 hours.  ProBNP (last 3 results) No results for input(s): PROBNP in the last 8760 hours.  Radiological  Exams: No results found.  Assessment/Plan Active Problems:   Acute on chronic respiratory failure with hypoxia (HCC)   End stage COPD (HCC)   Pneumonia due to Moraxella catarrhalis (HCC)   Chronic kidney disease, stage III (moderate) (HCC)   Acute on chronic respiratory failure hypoxia plan is continue to continue with the weaning advance to capping trials End-stage COPD we will continue to monitor Pneumonia due to Moraxella catarrhalis treated with slow improvement Chronic kidney disease stage III we will continue to monitor   I have personally seen and evaluated the patient, evaluated laboratory and imaging results, formulated the assessment and plan and placed orders. The Patient requires high complexity decision making with multiple systems involvement.  Rounds were done with the Respiratory Therapy Director and Staff therapists and discussed with nursing staff also.  Allyne Gee, MD [facility] Pulmonary Critical Care Medicine Sleep Medicine

## 2021-02-18 LAB — HEPARIN LEVEL (UNFRACTIONATED)
Heparin Unfractionated: 0.31 IU/mL (ref 0.30–0.70)
Heparin Unfractionated: 0.17 IU/mL — ABNORMAL LOW (ref 0.30–0.70)

## 2021-02-18 LAB — PROTIME-INR
INR: 2.9 — ABNORMAL HIGH (ref 0.8–1.2)
INR: 3 — ABNORMAL HIGH (ref 0.8–1.2)
Prothrombin Time: 30.4 seconds — ABNORMAL HIGH (ref 11.4–15.2)
Prothrombin Time: 31.1 seconds — ABNORMAL HIGH (ref 11.4–15.2)

## 2021-02-18 NOTE — Progress Notes (Signed)
Pulmonary Critical Care Medicine [place]   PULMONARY CRITICAL CARE SERVICE  PROGRESS NOTE     Jackson Hahn  LLV:[id number]  DOB: 10/02/1960   DOA: 01/28/2021  Referring Physician: Merton Border, MD  HPI: Jackson Hahn is a 60 y.o. male being followed for ventilator/airway/oxygen weaning Acute on Chronic Respiratory Failure.  Patient is capping now on 6 L oxygen symptoms to be doing well.  Medications: Reviewed on Rounds  Physical Exam:  Vitals: Temperature 97.5 pulse 97 respiratory 17 blood pressure is 1 7/51 saturations 95%  Ventilator Settings capping on 6 L O2  General: Comfortable at this time Neck: supple Cardiovascular: no malignant arrhythmias Respiratory: No rhonchi very coarse breath sounds Skin: no rash seen on limited exam Musculoskeletal: No gross abnormality Psychiatric:unable to assess Neurologic:no involuntary movements         Lab Data:   Basic Metabolic Panel: Recent Labs  Lab 02/14/21 0812  NA 136  K 4.9  CL 97*  CO2 33*  GLUCOSE 100*  BUN 73*  CREATININE 2.79*  CALCIUM 11.0*  MG 2.5*  PHOS 5.4*    ABG: No results for input(s): PHART, PCO2ART, PO2ART, HCO3, O2SAT in the last 168 hours.  Liver Function Tests: Recent Labs  Lab 02/14/21 0812  ALBUMIN 3.1*   No results for input(s): LIPASE, AMYLASE in the last 168 hours. No results for input(s): AMMONIA in the last 168 hours.  CBC: Recent Labs  Lab 02/14/21 0812  WBC 9.2  HGB 11.2*  HCT 34.8*  MCV 101.2*  PLT 192    Cardiac Enzymes: No results for input(s): CKTOTAL, CKMB, CKMBINDEX, TROPONINI in the last 168 hours.  BNP (last 3 results) No results for input(s): BNP in the last 8760 hours.  ProBNP (last 3 results) No results for input(s): PROBNP in the last 8760 hours.  Radiological Exams: No results found.  Assessment/Plan Active Problems:   Acute on chronic respiratory failure with hypoxia (HCC)   End stage COPD (HCC)   Pneumonia due  to Moraxella catarrhalis (HCC)   Chronic kidney disease, stage III (moderate) (HCC)   Acute on chronic respiratory failure hypoxia currently is capping on 6 L we will continue with the capping trial seems to be tolerating it well End-stage COPD supportive care we will continue to monitor closely Pneumonia due to Moraxella catarrhalis treated with antibiotics slow improvement Chronic kidney disease stage III supportive care follow-up on fluid status and hypertension   I have personally seen and evaluated the patient, evaluated laboratory and imaging results, formulated the assessment and plan and placed orders. The Patient requires high complexity decision making with multiple systems involvement.  Rounds were done with the Respiratory Therapy Director and Staff therapists and discussed with nursing staff also.  Allyne Gee, MD [facility] Pulmonary Critical Care Medicine Sleep Medicine

## 2021-02-19 LAB — BASIC METABOLIC PANEL
Anion gap: 6 (ref 5–15)
BUN: 102 mg/dL — ABNORMAL HIGH (ref 6–20)
CO2: 34 mmol/L — ABNORMAL HIGH (ref 22–32)
Calcium: 10.5 mg/dL — ABNORMAL HIGH (ref 8.9–10.3)
Chloride: 96 mmol/L — ABNORMAL LOW (ref 98–111)
Creatinine, Ser: 3.48 mg/dL — ABNORMAL HIGH (ref 0.61–1.24)
GFR, Estimated: 19 mL/min — ABNORMAL LOW (ref >60)
Glucose, Bld: 125 mg/dL — ABNORMAL HIGH (ref 70–99)
Potassium: 4.5 mmol/L (ref 3.5–5.1)
Sodium: 136 mmol/L (ref 135–145)

## 2021-02-19 LAB — CBC
HCT: 31.1 % — ABNORMAL LOW (ref 39.0–52.0)
Hemoglobin: 10 g/dL — ABNORMAL LOW (ref 13.0–17.0)
MCH: 32.8 pg (ref 26.0–34.0)
MCHC: 32.2 g/dL (ref 30.0–36.0)
MCV: 102 fL — ABNORMAL HIGH (ref 80.0–100.0)
Platelets: 173 10*3/uL (ref 150–400)
RBC: 3.05 MIL/uL — ABNORMAL LOW (ref 4.22–5.81)
RDW: 14.6 % (ref 11.5–15.5)
WBC: 10.6 10*3/uL — ABNORMAL HIGH (ref 4.0–10.5)
nRBC: 0 % (ref 0.0–0.2)

## 2021-02-19 LAB — PROTIME-INR
INR: 2.9 — ABNORMAL HIGH (ref 0.8–1.2)
Prothrombin Time: 30.1 seconds — ABNORMAL HIGH (ref 11.4–15.2)

## 2021-02-19 NOTE — Progress Notes (Signed)
Pulmonary Critical Care Medicine [place]   PULMONARY CRITICAL CARE SERVICE  PROGRESS NOTE     LEONEL MCCOLLUM  YTK:[id number]  DOB: Mar 09, 1961   DOA: 01/28/2021  Referring Physician: Merton Border, MD  HPI: LARK RUNK is a 60 y.o. male being followed for ventilator/airway/oxygen weaning Acute on Chronic Respiratory Failure.  Patient is capping secretions are still quite copious unfortunately  Medications: Reviewed on Rounds  Physical Exam:  Vitals: Temperature is 96.8 pulse 101 respiratory rate is 20 blood pressure is 102/65 saturations 97%  Ventilator Settings capping of the ventilator  General: Comfortable at this time Neck: supple Cardiovascular: no malignant arrhythmias Respiratory: No rhonchi no rales are noted at this time Skin: no rash seen on limited exam Musculoskeletal: No gross abnormality Psychiatric:unable to assess Neurologic:no involuntary movements         Lab Data:   Basic Metabolic Panel: Recent Labs  Lab 02/14/21 0812  NA 136  K 4.9  CL 97*  CO2 33*  GLUCOSE 100*  BUN 73*  CREATININE 2.79*  CALCIUM 11.0*  MG 2.5*  PHOS 5.4*    ABG: No results for input(s): PHART, PCO2ART, PO2ART, HCO3, O2SAT in the last 168 hours.  Liver Function Tests: Recent Labs  Lab 02/14/21 0812  ALBUMIN 3.1*   No results for input(s): LIPASE, AMYLASE in the last 168 hours. No results for input(s): AMMONIA in the last 168 hours.  CBC: Recent Labs  Lab 02/14/21 0812  WBC 9.2  HGB 11.2*  HCT 34.8*  MCV 101.2*  PLT 192    Cardiac Enzymes: No results for input(s): CKTOTAL, CKMB, CKMBINDEX, TROPONINI in the last 168 hours.  BNP (last 3 results) No results for input(s): BNP in the last 8760 hours.  ProBNP (last 3 results) No results for input(s): PROBNP in the last 8760 hours.  Radiological Exams: No results found.  Assessment/Plan Active Problems:   Acute on chronic respiratory failure with hypoxia (HCC)   End  stage COPD (HCC)   Pneumonia due to Moraxella catarrhalis (HCC)   Chronic kidney disease, stage III (moderate) (HCC)   Acute on chronic respiratory failure with hypoxia patient is capping he states that he has some difficulty bringing this secretions up beyond where the tracheostomy is.  I am spoke to him about doing the Acapella device to help with mobilization of his secretions. End-stage COPD we will continue with medical management Pneumonia due to Moraxella no change we will continue with supportive care Chronic kidney disease stage III supportive care   I have personally seen and evaluated the patient, evaluated laboratory and imaging results, formulated the assessment and plan and placed orders. The Patient requires high complexity decision making with multiple systems involvement.  Rounds were done with the Respiratory Therapy Director and Staff therapists and discussed with nursing staff also.  Allyne Gee, MD [facility] Pulmonary Critical Care Medicine Sleep Medicine

## 2021-02-20 LAB — PROTIME-INR
INR: 2.9 — ABNORMAL HIGH (ref 0.8–1.2)
Prothrombin Time: 30.4 seconds — ABNORMAL HIGH (ref 11.4–15.2)

## 2021-02-20 NOTE — Progress Notes (Signed)
Pulmonary Critical Care Medicine [place]   PULMONARY CRITICAL CARE SERVICE  PROGRESS NOTE     Jackson Hahn  GXQ:[id number]  DOB: Sep 12, 1960   DOA: 01/28/2021  Referring Physician: Merton Border, Jackson Hahn  HPI: Jackson Hahn is a 60 y.o. male being followed for ventilator/airway/oxygen weaning Acute on Chronic Respiratory Failure.  Patient is capping on 6 L of oxygen secretions still are an issue but he does well with clearing them  Medications: Reviewed on Rounds  Physical Exam:  Vitals: Temperature is 96.2 pulse 98 respiratory 16 blood pressure is 121/88 saturations 98%  Ventilator Settings capping on 6 L oxygen  General: Comfortable at this time Neck: supple Cardiovascular: no malignant arrhythmias Respiratory: No rhonchi very coarse breath Skin: no rash seen on limited exam Musculoskeletal: No gross abnormality Psychiatric:unable to assess Neurologic:no involuntary movements         Lab Data:   Basic Metabolic Panel: Recent Labs  Lab 02/14/21 0812 02/19/21 2140  NA 136 136  K 4.9 4.5  CL 97* 96*  CO2 33* 34*  GLUCOSE 100* 125*  BUN 73* 102*  CREATININE 2.79* 3.48*  CALCIUM 11.0* 10.5*  MG 2.5*  --   PHOS 5.4*  --     ABG: No results for input(s): PHART, PCO2ART, PO2ART, HCO3, O2SAT in the last 168 hours.  Liver Function Tests: Recent Labs  Lab 02/14/21 0812  ALBUMIN 3.1*   No results for input(s): LIPASE, AMYLASE in the last 168 hours. No results for input(s): AMMONIA in the last 168 hours.  CBC: Recent Labs  Lab 02/14/21 0812 02/19/21 1439  WBC 9.2 10.6*  HGB 11.2* 10.0*  HCT 34.8* 31.1*  MCV 101.2* 102.0*  PLT 192 173    Cardiac Enzymes: No results for input(s): CKTOTAL, CKMB, CKMBINDEX, TROPONINI in the last 168 hours.  BNP (last 3 results) No results for input(s): BNP in the last 8760 hours.  ProBNP (last 3 results) No results for input(s): PROBNP in the last 8760 hours.  Radiological Exams: No results  found.  Assessment/Plan Active Problems:   Acute on chronic respiratory failure with hypoxia (HCC)   End stage COPD (HCC)   Pneumonia due to Moraxella catarrhalis (HCC)   Chronic kidney disease, stage III (moderate) (HCC)   Acute on chronic respiratory failure with hypoxia we will continue with Monitor.  I think he may even do well with decannulation because his main issue seems to be the secretions getting hung up around the tracheostomy site other than that he has a good strong cough End-stage COPD medical management with oxygen therapy nebulizers inhalers Pneumonia due to Moraxella catarrhalis has been treated we will continue to monitor closely we will even consider suppressive antibiotic therapy Chronic kidney disease stage III we will continue to monitor closely.   I have personally seen and evaluated the patient, evaluated laboratory and imaging results, formulated the assessment and plan and placed orders. The Patient requires high complexity decision making with multiple systems involvement.  Rounds were done with the Respiratory Therapy Director and Staff therapists and discussed with nursing staff also.  Jackson Gee, Jackson Hahn [facility] Pulmonary Critical Care Medicine Sleep Medicine

## 2021-02-21 LAB — CBC
HCT: 33.2 % — ABNORMAL LOW (ref 39.0–52.0)
Hemoglobin: 10.8 g/dL — ABNORMAL LOW (ref 13.0–17.0)
MCH: 33 pg (ref 26.0–34.0)
MCHC: 32.5 g/dL (ref 30.0–36.0)
MCV: 101.5 fL — ABNORMAL HIGH (ref 80.0–100.0)
Platelets: 186 10*3/uL (ref 150–400)
RBC: 3.27 MIL/uL — ABNORMAL LOW (ref 4.22–5.81)
RDW: 14.6 % (ref 11.5–15.5)
WBC: 10.2 10*3/uL (ref 4.0–10.5)
nRBC: 0 % (ref 0.0–0.2)

## 2021-02-21 LAB — BASIC METABOLIC PANEL
Anion gap: 8 (ref 5–15)
BUN: 107 mg/dL — ABNORMAL HIGH (ref 6–20)
CO2: 34 mmol/L — ABNORMAL HIGH (ref 22–32)
Calcium: 11.3 mg/dL — ABNORMAL HIGH (ref 8.9–10.3)
Chloride: 95 mmol/L — ABNORMAL LOW (ref 98–111)
Creatinine, Ser: 3.27 mg/dL — ABNORMAL HIGH (ref 0.61–1.24)
GFR, Estimated: 21 mL/min — ABNORMAL LOW (ref >60)
Glucose, Bld: 112 mg/dL — ABNORMAL HIGH (ref 70–99)
Potassium: 5.4 mmol/L — ABNORMAL HIGH (ref 3.5–5.1)
Sodium: 137 mmol/L (ref 135–145)

## 2021-02-21 LAB — PROTIME-INR
INR: 2.1 — ABNORMAL HIGH (ref 0.8–1.2)
Prothrombin Time: 23.5 seconds — ABNORMAL HIGH (ref 11.4–15.2)

## 2021-02-21 NOTE — Progress Notes (Signed)
Pulmonary Critical Care Medicine [place]   PULMONARY CRITICAL CARE SERVICE  PROGRESS NOTE     Jackson Hahn  [id number]:[id number]  DOB: Apr 04, 1961   DOA: 01/28/2021  Referring Physician: Merton Border, MD  HPI: Jackson Hahn is a 60 y.o. male being followed for ventilator/airway/oxygen weaning Acute on Chronic Respiratory Failure.  Patient is comfortable right now without any distress.  Has been afebrile and has been doing well with capping.  Still feels that the sputum gets stuck right around where the trach is and I think he may actually benefit from a smaller trach  Medications: Reviewed on Rounds  Physical Exam:  Vitals: Temperature is 97.6 pulse 62 respiratory is 19 blood pressure is 9/65 saturations 99%  Ventilator Settings capping off the ventilator  General: Comfortable at this time Neck: supple Cardiovascular: no malignant arrhythmias Respiratory: Scattered rhonchi expansion is equal Skin: no rash seen on limited exam Musculoskeletal: No gross abnormality Psychiatric:unable to assess Neurologic:no involuntary movements         Lab Data:   Basic Metabolic Panel: Recent Labs  Lab 02/19/21 2140 02/21/21 0713  NA 136 137  K 4.5 5.4*  CL 96* 95*  CO2 34* 34*  GLUCOSE 125* 112*  BUN 102* 107*  CREATININE 3.48* 3.27*  CALCIUM 10.5* 11.3*    ABG: No results for input(s): PHART, PCO2ART, PO2ART, HCO3, O2SAT in the last 168 hours.  Liver Function Tests: No results for input(s): AST, ALT, ALKPHOS, BILITOT, PROT, ALBUMIN in the last 168 hours. No results for input(s): LIPASE, AMYLASE in the last 168 hours. No results for input(s): AMMONIA in the last 168 hours.  CBC: Recent Labs  Lab 02/19/21 1439 02/21/21 0713  WBC 10.6* 10.2  HGB 10.0* 10.8*  HCT 31.1* 33.2*  MCV 102.0* 101.5*  PLT 173 186    Cardiac Enzymes: No results for input(s): CKTOTAL, CKMB, CKMBINDEX, TROPONINI in the last 168 hours.  BNP (last 3 results) No  results for input(s): BNP in the last 8760 hours.  ProBNP (last 3 results) No results for input(s): PROBNP in the last 8760 hours.  Radiological Exams: No results found.  Assessment/Plan Active Problems:   Acute on chronic respiratory failure with hypoxia (HCC)   End stage COPD (HCC)   Pneumonia due to Moraxella catarrhalis (HCC)   Chronic kidney disease, stage III (moderate) (HCC)   Acute on chronic respiratory failure hypoxia plan is going to be to proceed to change trach out to a #4 trach I think this will help him to clear his secretions better and we should be able to then proceed towards decannulation ultimately End-stage COPD we will continue with supportive care Pneumonia due to Moraxella catarrhalis treated with antibiotics clinically is improved Chronic kidney disease stage III we will continue to follow along closely   I have personally seen and evaluated the patient, evaluated laboratory and imaging results, formulated the assessment and plan and placed orders. The Patient requires high complexity decision making with multiple systems involvement.  Rounds were done with the Respiratory Therapy Director and Staff therapists and discussed with nursing staff also.  Allyne Gee, MD [facility] Pulmonary Critical Care Medicine Sleep Medicine

## 2021-02-22 ENCOUNTER — Other Ambulatory Visit (HOSPITAL_COMMUNITY): Payer: Medicare Other

## 2021-02-22 DIAGNOSIS — J449 Chronic obstructive pulmonary disease, unspecified: Secondary | ICD-10-CM | POA: Diagnosis not present

## 2021-02-22 DIAGNOSIS — J9621 Acute and chronic respiratory failure with hypoxia: Secondary | ICD-10-CM | POA: Diagnosis not present

## 2021-02-22 DIAGNOSIS — N1832 Chronic kidney disease, stage 3b: Secondary | ICD-10-CM | POA: Diagnosis not present

## 2021-02-22 DIAGNOSIS — J156 Pneumonia due to other aerobic Gram-negative bacteria: Secondary | ICD-10-CM | POA: Diagnosis not present

## 2021-02-22 LAB — PHOSPHORUS: Phosphorus: 4.9 mg/dL — ABNORMAL HIGH (ref 2.5–4.6)

## 2021-02-22 LAB — CBC
HCT: 29.5 % — ABNORMAL LOW (ref 39.0–52.0)
Hemoglobin: 9.2 g/dL — ABNORMAL LOW (ref 13.0–17.0)
MCH: 31.9 pg (ref 26.0–34.0)
MCHC: 31.2 g/dL (ref 30.0–36.0)
MCV: 102.4 fL — ABNORMAL HIGH (ref 80.0–100.0)
Platelets: 187 10*3/uL (ref 150–400)
RBC: 2.88 MIL/uL — ABNORMAL LOW (ref 4.22–5.81)
RDW: 14.6 % (ref 11.5–15.5)
WBC: 9.5 10*3/uL (ref 4.0–10.5)
nRBC: 0 % (ref 0.0–0.2)

## 2021-02-22 LAB — PTH, INTACT AND CALCIUM
Calcium, Total (PTH): 10.1 mg/dL (ref 8.7–10.2)
Calcium, Total (PTH): 10.4 mg/dL — ABNORMAL HIGH (ref 8.7–10.2)
PTH: 12 pg/mL — ABNORMAL LOW (ref 15–65)
PTH: 12 pg/mL — ABNORMAL LOW (ref 15–65)

## 2021-02-22 LAB — CALCIUM, IONIZED: Calcium, Ionized, Serum: 5.5 mg/dL (ref 4.5–5.6)

## 2021-02-22 LAB — BASIC METABOLIC PANEL
Anion gap: 13 (ref 5–15)
BUN: 114 mg/dL — ABNORMAL HIGH (ref 6–20)
CO2: 30 mmol/L (ref 22–32)
Calcium: 10.7 mg/dL — ABNORMAL HIGH (ref 8.9–10.3)
Chloride: 95 mmol/L — ABNORMAL LOW (ref 98–111)
Creatinine, Ser: 3.18 mg/dL — ABNORMAL HIGH (ref 0.61–1.24)
GFR, Estimated: 22 mL/min — ABNORMAL LOW (ref >60)
Glucose, Bld: 119 mg/dL — ABNORMAL HIGH (ref 70–99)
Potassium: 5.4 mmol/L — ABNORMAL HIGH (ref 3.5–5.1)
Sodium: 138 mmol/L (ref 135–145)

## 2021-02-22 LAB — MAGNESIUM: Magnesium: 2.4 mg/dL (ref 1.7–2.4)

## 2021-02-22 LAB — PROTIME-INR
INR: 1.7 — ABNORMAL HIGH (ref 0.8–1.2)
Prothrombin Time: 19.6 seconds — ABNORMAL HIGH (ref 11.4–15.2)

## 2021-02-22 NOTE — Progress Notes (Signed)
Pulmonary Critical Care Medicine [place]   PULMONARY CRITICAL CARE SERVICE  PROGRESS NOTE     Jackson Hahn  [id number]:[id number]  DOB: 30-Jun-1961   DOA: 01/28/2021  Referring Physician: Merton Border, MD  HPI: Jackson Hahn is a 60 y.o. male being followed for ventilator/airway/oxygen weaning Acute on Chronic Respiratory Failure.  Patient is capping right now doing fairly well.  Has been resting comfortably without any distress  Medications: Reviewed on Rounds  Physical Exam:  Vitals: Temperature is 98.1 pulse 108 respiratory 26 blood pressure is 152/92 saturations 99%  Ventilator Settings capping right now without distress  General: Comfortable at this time Neck: supple Cardiovascular: no malignant arrhythmias Respiratory: No rhonchi no rales are noted at this time Skin: no rash seen on limited exam Musculoskeletal: No gross abnormality Psychiatric:unable to assess Neurologic:no involuntary movements         Lab Data:   Basic Metabolic Panel: Recent Labs  Lab 02/19/21 1439 02/19/21 2140 02/21/21 0713 02/22/21 0406  NA  --  136 137 138  K  --  4.5 5.4* 5.4*  CL  --  96* 95* 95*  CO2  --  34* 34* 30  GLUCOSE  --  125* 112* 119*  BUN  --  102* 107* 114*  CREATININE  --  3.48* 3.27* 3.18*  CALCIUM 10.1 10.5* 11.3*  10.4* 10.7*  MG  --   --   --  2.4  PHOS  --   --   --  4.9*    ABG: No results for input(s): PHART, PCO2ART, PO2ART, HCO3, O2SAT in the last 168 hours.  Liver Function Tests: No results for input(s): AST, ALT, ALKPHOS, BILITOT, PROT, ALBUMIN in the last 168 hours. No results for input(s): LIPASE, AMYLASE in the last 168 hours. No results for input(s): AMMONIA in the last 168 hours.  CBC: Recent Labs  Lab 02/19/21 1439 02/21/21 0713  WBC 10.6* 10.2  HGB 10.0* 10.8*  HCT 31.1* 33.2*  MCV 102.0* 101.5*  PLT 173 186    Cardiac Enzymes: No results for input(s): CKTOTAL, CKMB, CKMBINDEX, TROPONINI in the last 168  hours.  BNP (last 3 results) No results for input(s): BNP in the last 8760 hours.  ProBNP (last 3 results) No results for input(s): PROBNP in the last 8760 hours.  Radiological Exams: DG Chest Port 1 View  Result Date: 02/22/2021 CLINICAL DATA:  Hypoxia. EXAM: PORTABLE CHEST 1 VIEW COMPARISON:  01/28/2021. FINDINGS: Tracheostomy tube in stable position. Prior CABG. Cardiomegaly. Low lung volumes. Persistent left base and infiltrates/edema again noted. Right base atelectasis and infiltrates/edema noted on today's exam. Small bilateral pleural effusions cannot be excluded. No pneumothorax. Left cervical rib noted. Degenerative change thoracic spine. IMPRESSION: 1.  Tracheostomy tube in stable position. 2.  Prior CABG.  Scratch cardiomegaly. 3. Persistent left base atelectasis and infiltrates/edema again noted. Right base atelectasis and infiltrates/edema noted on today's exam. Small bilateral pleural effusions cannot be excluded. CHF could present in this fashion. Electronically Signed   By: Marcello Moores  Register   On: 02/22/2021 09:10    Assessment/Plan Active Problems:   Acute on chronic respiratory failure with hypoxia (HCC)   End stage COPD (HCC)   Pneumonia due to Moraxella catarrhalis (HCC)   Chronic kidney disease, stage III (moderate) (HCC)   Acute on chronic respiratory failure with hypoxia patient has been doing fine with capping currently has some desaturations noted at nighttime this is directly probably related to the fact that the patient is  refusing to use the BiPAP at nighttime.  I spoke with him at length he states that he is not getting enough air so I talked to respiratory therapy and we are going to try to adjust his flows slow to improve his compliance End-stage COPD we will continue with medical management and supportive care.  Nebulizers as necessary Pneumonia due to Moraxella has been treated we will continue to follow along closely Chronic kidney disease stage III we will  continue with supportive care   I have personally seen and evaluated the patient, evaluated laboratory and imaging results, formulated the assessment and plan and placed orders. The Patient requires high complexity decision making with multiple systems involvement.  Rounds were done with the Respiratory Therapy Director and Staff therapists and discussed with nursing staff also.  Allyne Gee, MD [facility] Pulmonary Critical Care Medicine Sleep Medicine

## 2021-02-23 DIAGNOSIS — J9621 Acute and chronic respiratory failure with hypoxia: Secondary | ICD-10-CM | POA: Diagnosis not present

## 2021-02-23 DIAGNOSIS — J449 Chronic obstructive pulmonary disease, unspecified: Secondary | ICD-10-CM | POA: Diagnosis not present

## 2021-02-23 DIAGNOSIS — N1832 Chronic kidney disease, stage 3b: Secondary | ICD-10-CM | POA: Diagnosis not present

## 2021-02-23 DIAGNOSIS — J156 Pneumonia due to other aerobic Gram-negative bacteria: Secondary | ICD-10-CM | POA: Diagnosis not present

## 2021-02-23 LAB — BASIC METABOLIC PANEL
Anion gap: 7 (ref 5–15)
BUN: 115 mg/dL — ABNORMAL HIGH (ref 6–20)
CO2: 34 mmol/L — ABNORMAL HIGH (ref 22–32)
Calcium: 9.9 mg/dL (ref 8.9–10.3)
Chloride: 96 mmol/L — ABNORMAL LOW (ref 98–111)
Creatinine, Ser: 3.19 mg/dL — ABNORMAL HIGH (ref 0.61–1.24)
GFR, Estimated: 22 mL/min — ABNORMAL LOW (ref >60)
Glucose, Bld: 132 mg/dL — ABNORMAL HIGH (ref 70–99)
Potassium: 4.4 mmol/L (ref 3.5–5.1)
Sodium: 137 mmol/L (ref 135–145)

## 2021-02-23 LAB — BLOOD GAS, ARTERIAL
Acid-Base Excess: 8.2 mmol/L — ABNORMAL HIGH (ref 0.0–2.0)
Bicarbonate: 34.4 mmol/L — ABNORMAL HIGH (ref 20.0–28.0)
FIO2: 44
O2 Saturation: 93.4 %
Patient temperature: 36.1
pCO2 arterial: 67.6 mmHg (ref 32.0–48.0)
pH, Arterial: 7.321 — ABNORMAL LOW (ref 7.350–7.450)
pO2, Arterial: 62.4 mmHg — ABNORMAL LOW (ref 83.0–108.0)

## 2021-02-23 LAB — PROTIME-INR
INR: 1.5 — ABNORMAL HIGH (ref 0.8–1.2)
Prothrombin Time: 18.2 seconds — ABNORMAL HIGH (ref 11.4–15.2)

## 2021-02-23 NOTE — Progress Notes (Signed)
Pulmonary Critical Care Medicine [place]   PULMONARY CRITICAL CARE SERVICE  PROGRESS NOTE     Jackson Hahn  [id number]:[id number]  DOB: 04-23-1961   DOA: 01/28/2021  Referring Physician: Merton Border, MD  HPI: Jackson Hahn is a 60 y.o. male being followed for ventilator/airway/oxygen weaning Acute on Chronic Respiratory Failure.  Patient is on capping trials has been refusing the BiPAP but once again tried to speak with him explained to him the importance of compliance with the BiPAP  Medications: Reviewed on Rounds  Physical Exam:  Vitals: Temperature is 98.6 pulse 104 respiratory rate is 16 blood pressure 127/69 saturations 98%  Ventilator Settings currently is capping off the ventilator  General: Comfortable at this time Neck: supple Cardiovascular: no malignant arrhythmias Respiratory: No rhonchi no rales are noted at this time Skin: no rash seen on limited exam Musculoskeletal: No gross abnormality Psychiatric:unable to assess Neurologic:no involuntary movements         Lab Data:   Basic Metabolic Panel: Recent Labs  Lab 02/19/21 2140 02/21/21 0713 02/22/21 0406 02/23/21 0431  NA 136 137 138 137  K 4.5 5.4* 5.4* 4.4  CL 96* 95* 95* 96*  CO2 34* 34* 30 34*  GLUCOSE 125* 112* 119* 132*  BUN 102* 107* 114* 115*  CREATININE 3.48* 3.27* 3.18* 3.19*  CALCIUM 10.5* 11.3*  10.4* 10.7* 9.9  MG  --   --  2.4  --   PHOS  --   --  4.9*  --     ABG: Recent Labs  Lab 02/23/21 1430  PHART 7.321*  PCO2ART 67.6*  PO2ART 62.4*  HCO3 34.4*  O2SAT 93.4    Liver Function Tests: No results for input(s): AST, ALT, ALKPHOS, BILITOT, PROT, ALBUMIN in the last 168 hours. No results for input(s): LIPASE, AMYLASE in the last 168 hours. No results for input(s): AMMONIA in the last 168 hours.  CBC: Recent Labs  Lab 02/19/21 1439 02/21/21 0713 02/22/21 1426  WBC 10.6* 10.2 9.5  HGB 10.0* 10.8* 9.2*  HCT 31.1* 33.2* 29.5*  MCV 102.0*  101.5* 102.4*  PLT 173 186 187    Cardiac Enzymes: No results for input(s): CKTOTAL, CKMB, CKMBINDEX, TROPONINI in the last 168 hours.  BNP (last 3 results) No results for input(s): BNP in the last 8760 hours.  ProBNP (last 3 results) No results for input(s): PROBNP in the last 8760 hours.  Radiological Exams: DG Chest Port 1 View  Result Date: 02/22/2021 CLINICAL DATA:  Hypoxia. EXAM: PORTABLE CHEST 1 VIEW COMPARISON:  01/28/2021. FINDINGS: Tracheostomy tube in stable position. Prior CABG. Cardiomegaly. Low lung volumes. Persistent left base and infiltrates/edema again noted. Right base atelectasis and infiltrates/edema noted on today's exam. Small bilateral pleural effusions cannot be excluded. No pneumothorax. Left cervical rib noted. Degenerative change thoracic spine. IMPRESSION: 1.  Tracheostomy tube in stable position. 2.  Prior CABG.  Scratch cardiomegaly. 3. Persistent left base atelectasis and infiltrates/edema again noted. Right base atelectasis and infiltrates/edema noted on today's exam. Small bilateral pleural effusions cannot be excluded. CHF could present in this fashion. Electronically Signed   By: Marcello Moores  Register   On: 02/22/2021 09:10    Assessment/Plan Active Problems:   Acute on chronic respiratory failure with hypoxia (HCC)   End stage COPD ([place])   Pneumonia due to Moraxella catarrhalis (HCC)   Chronic kidney disease, stage III (moderate) (HCC)   Acute on chronic respiratory failure with hypoxia patient needs to be compliant with recommended therapy patient has  not been compliant with the BiPAP as has been recommended for him. End-stage COPD we will continue with medical management Pneumonia due to Moraxella catarrhalis has been treated we will continue with supportive care Chronic kidney disease stage III following patient's labs and hydration status   I have personally seen and evaluated the patient, evaluated laboratory and imaging results, formulated the  assessment and plan and placed orders. The Patient requires high complexity decision making with multiple systems involvement.  Rounds were done with the Respiratory Therapy Director and Staff therapists and discussed with nursing staff also.  Allyne Gee, MD [facility] Pulmonary Critical Care Medicine Sleep Medicine

## 2021-02-24 ENCOUNTER — Other Ambulatory Visit (HOSPITAL_COMMUNITY): Payer: Medicare Other

## 2021-02-24 DIAGNOSIS — J156 Pneumonia due to other aerobic Gram-negative bacteria: Secondary | ICD-10-CM | POA: Diagnosis not present

## 2021-02-24 DIAGNOSIS — J9621 Acute and chronic respiratory failure with hypoxia: Secondary | ICD-10-CM | POA: Diagnosis not present

## 2021-02-24 DIAGNOSIS — J449 Chronic obstructive pulmonary disease, unspecified: Secondary | ICD-10-CM | POA: Diagnosis not present

## 2021-02-24 DIAGNOSIS — N1832 Chronic kidney disease, stage 3b: Secondary | ICD-10-CM | POA: Diagnosis not present

## 2021-02-24 LAB — RENAL FUNCTION PANEL
Albumin: 2.5 g/dL — ABNORMAL LOW (ref 3.5–5.0)
Anion gap: 9 (ref 5–15)
BUN: 119 mg/dL — ABNORMAL HIGH (ref 6–20)
CO2: 36 mmol/L — ABNORMAL HIGH (ref 22–32)
Calcium: 10.2 mg/dL (ref 8.9–10.3)
Chloride: 93 mmol/L — ABNORMAL LOW (ref 98–111)
Creatinine, Ser: 3.19 mg/dL — ABNORMAL HIGH (ref 0.61–1.24)
GFR, Estimated: 22 mL/min — ABNORMAL LOW (ref >60)
Glucose, Bld: 118 mg/dL — ABNORMAL HIGH (ref 70–99)
Phosphorus: 4.5 mg/dL (ref 2.5–4.6)
Potassium: 4.5 mmol/L (ref 3.5–5.1)
Sodium: 138 mmol/L (ref 135–145)

## 2021-02-24 LAB — CBC
HCT: 29.5 % — ABNORMAL LOW (ref 39.0–52.0)
Hemoglobin: 9.2 g/dL — ABNORMAL LOW (ref 13.0–17.0)
MCH: 31.7 pg (ref 26.0–34.0)
MCHC: 31.2 g/dL (ref 30.0–36.0)
MCV: 101.7 fL — ABNORMAL HIGH (ref 80.0–100.0)
Platelets: 184 10*3/uL (ref 150–400)
RBC: 2.9 MIL/uL — ABNORMAL LOW (ref 4.22–5.81)
RDW: 14.6 % (ref 11.5–15.5)
WBC: 8 10*3/uL (ref 4.0–10.5)
nRBC: 0 % (ref 0.0–0.2)

## 2021-02-24 LAB — MAGNESIUM: Magnesium: 2.4 mg/dL (ref 1.7–2.4)

## 2021-02-24 LAB — CULTURE, RESPIRATORY W GRAM STAIN: Culture: NORMAL

## 2021-02-24 LAB — PROTIME-INR
INR: 1.5 — ABNORMAL HIGH (ref 0.8–1.2)
Prothrombin Time: 18.4 seconds — ABNORMAL HIGH (ref 11.4–15.2)

## 2021-02-24 NOTE — Progress Notes (Signed)
Pulmonary Critical Care Medicine [place] Hahn   PULMONARY CRITICAL CARE SERVICE  PROGRESS NOTE     Jackson Hahn  [id number]:[id number]  DOB: September 12, 1960   DOA: 01/28/2021  Referring Physician: Merton Border, MD  HPI: Jackson Hahn is a 60 y.o. male being followed for ventilator/airway/oxygen weaning Acute on Chronic Respiratory Failure.  Patient is capping at this time has been on 6 L of oxygen and has been more compliant with the BiPAP  Medications: Reviewed on Rounds  Physical Exam:  Vitals: Temperature is 97.3 pulse 97 respiratory rate is 15 blood pressure is 125/70 saturations 97%  Ventilator Settings capping on 6 L O2  General: Comfortable at this time Neck: supple Cardiovascular: no malignant arrhythmias Respiratory: No rhonchi no rales are noted at this time Skin: no rash seen on limited exam Musculoskeletal: No gross abnormality Psychiatric:unable to assess Neurologic:no involuntary movements         Lab Data:   Basic Metabolic Panel: Recent Labs  Lab 02/19/21 2140 02/21/21 0713 02/22/21 0406 02/23/21 0431 02/24/21 0348  NA 136 137 138 137 138  K 4.5 5.4* 5.4* 4.4 4.5  CL 96* 95* 95* 96* 93*  CO2 34* 34* 30 34* 36*  GLUCOSE 125* 112* 119* 132* 118*  BUN 102* 107* 114* 115* 119*  CREATININE 3.48* 3.27* 3.18* 3.19* 3.19*  CALCIUM 10.5* 11.3*  10.4* 10.7* 9.9 10.2  MG  --   --  2.4  --  2.4  PHOS  --   --  4.9*  --  4.5    ABG: Recent Labs  Lab 02/23/21 1430  PHART 7.321*  PCO2ART 67.6*  PO2ART 62.4*  HCO3 34.4*  O2SAT 93.4    Liver Function Tests: Recent Labs  Lab 02/24/21 0348  ALBUMIN 2.5*   No results for input(s): LIPASE, AMYLASE in the last 168 hours. No results for input(s): AMMONIA in the last 168 hours.  CBC: Recent Labs  Lab 02/19/21 1439 02/21/21 0713 02/22/21 1426 02/24/21 0348  WBC 10.6* 10.2 9.5 8.0  HGB 10.0* 10.8* 9.2* 9.2*  HCT 31.1* 33.2* 29.5* 29.5*  MCV 102.0* 101.5* 102.4* 101.7*  PLT 173  186 187 184    Cardiac Enzymes: No results for input(s): CKTOTAL, CKMB, CKMBINDEX, TROPONINI in the last 168 hours.  BNP (last 3 results) No results for input(s): BNP in the last 8760 hours.  ProBNP (last 3 results) No results for input(s): PROBNP in the last 8760 hours.  Radiological Exams: DG Chest Port 1 View  Result Date: 02/24/2021 CLINICAL DATA:  Respiratory failure. EXAM: PORTABLE CHEST 1 VIEW COMPARISON:  Chest x-ray 02/22/2021, CT chest 01/11/2021 FINDINGS: Tracheostomy in similar position. The heart size and mediastinal contours are unchanged. Cardiac surgical changes overlie the mediastinum. Aortic calcification. Persistent right to left mediastinal shift. Similar-appearing right lower lung zone airspace opacity and likely trace to small pleural effusion. Similar-appearing left lower lung zone airspace opacity with associated atelectasis. Superimposed left pleural effusion not excluded. No acute osseous abnormality. IMPRESSION: Unchanged findings of right lower lung zone airspace opacity and likely trace to small volume pleural effusion. Unchanged left lower lung zone airspace opacity and associated atelectasis with superimposed left pleural effusion not excluded. Persistent right to left mediastinal shift due to left lung volume loss. Electronically Signed   By: Iven Finn M.D.   On: 02/24/2021 05:23    Assessment/Plan Active Problems:   Acute on chronic respiratory failure with hypoxia (HCC)   End stage COPD (HCC)   Pneumonia due  to Moraxella catarrhalis (HCC)   Chronic kidney disease, stage III (moderate) (HCC)   Acute on chronic respiratory failure hypoxia plan is to continue with capping trials.  Patient seems to be doing well End-stage COPD supportive care we will continue to monitor Pneumonia due to Moraxella slow improvement Chronic kidney disease stage III supportive care   I have personally seen and evaluated the patient, evaluated laboratory and imaging  results, formulated the assessment and plan and placed orders. The Patient requires high complexity decision making with multiple systems involvement.  Rounds were done with the Respiratory Therapy Director and Staff therapists and discussed with nursing staff also.  Allyne Gee, MD [facility] Pulmonary Critical Care Medicine Sleep Medicine

## 2021-02-25 ENCOUNTER — Other Ambulatory Visit (HOSPITAL_COMMUNITY): Payer: Medicare Other

## 2021-02-25 LAB — CBC
HCT: 28.5 % — ABNORMAL LOW (ref 39.0–52.0)
Hemoglobin: 9.2 g/dL — ABNORMAL LOW (ref 13.0–17.0)
MCH: 32.3 pg (ref 26.0–34.0)
MCHC: 32.3 g/dL (ref 30.0–36.0)
MCV: 100 fL (ref 80.0–100.0)
Platelets: 202 10*3/uL (ref 150–400)
RBC: 2.85 MIL/uL — ABNORMAL LOW (ref 4.22–5.81)
RDW: 14.6 % (ref 11.5–15.5)
WBC: 8.7 10*3/uL (ref 4.0–10.5)
nRBC: 0 % (ref 0.0–0.2)

## 2021-02-25 LAB — PROTIME-INR
INR: 2 — ABNORMAL HIGH (ref 0.8–1.2)
Prothrombin Time: 22.9 seconds — ABNORMAL HIGH (ref 11.4–15.2)

## 2021-02-25 NOTE — Progress Notes (Signed)
Pulmonary Critical Care Medicine [place]   PULMONARY CRITICAL CARE SERVICE  PROGRESS NOTE     CANDY ZIEGLER  [id number]:[id number]  DOB: 05-24-1961   DOA: 01/28/2021  Referring Physician: Merton Border, MD  HPI: BARTOSZ LUGINBILL is a 60 y.o. male being followed for ventilator/airway/oxygen weaning Acute on Chronic Respiratory Failure.  Patient is using the BiPAP at nighttime is actually been doing better with compliance  Medications: Reviewed on Rounds  Physical Exam:  Vitals: Temperature is 97.0 pulse 85 respiratory to is 15 blood pressure is 124/82 saturations 99%  Ventilator Settings capping off the ventilator using BiPAP at night  General: Comfortable at this time Neck: supple Cardiovascular: no malignant arrhythmias Respiratory: No rhonchi very coarse breath sounds Skin: no rash seen on limited exam Musculoskeletal: No gross abnormality Psychiatric:unable to assess Neurologic:no involuntary movements         Lab Data:   Basic Metabolic Panel: Recent Labs  Lab 02/19/21 2140 02/21/21 0713 02/22/21 0406 02/23/21 0431 02/24/21 0348  NA 136 137 138 137 138  K 4.5 5.4* 5.4* 4.4 4.5  CL 96* 95* 95* 96* 93*  CO2 34* 34* 30 34* 36*  GLUCOSE 125* 112* 119* 132* 118*  BUN 102* 107* 114* 115* 119*  CREATININE 3.48* 3.27* 3.18* 3.19* 3.19*  CALCIUM 10.5* 11.3*  10.4* 10.7* 9.9 10.2  MG  --   --  2.4  --  2.4  PHOS  --   --  4.9*  --  4.5    ABG: Recent Labs  Lab 02/23/21 1430  PHART 7.321*  PCO2ART 67.6*  PO2ART 62.4*  HCO3 34.4*  O2SAT 93.4    Liver Function Tests: Recent Labs  Lab 02/24/21 0348  ALBUMIN 2.5*   No results for input(s): LIPASE, AMYLASE in the last 168 hours. No results for input(s): AMMONIA in the last 168 hours.  CBC: Recent Labs  Lab 02/19/21 1439 02/21/21 0713 02/22/21 1426 02/24/21 0348  WBC 10.6* 10.2 9.5 8.0  HGB 10.0* 10.8* 9.2* 9.2*  HCT 31.1* 33.2* 29.5* 29.5*  MCV 102.0* 101.5* 102.4* 101.7*   PLT 173 186 187 184    Cardiac Enzymes: No results for input(s): CKTOTAL, CKMB, CKMBINDEX, TROPONINI in the last 168 hours.  BNP (last 3 results) No results for input(s): BNP in the last 8760 hours.  ProBNP (last 3 results) No results for input(s): PROBNP in the last 8760 hours.  Radiological Exams: DG Chest Port 1 View  Result Date: 02/24/2021 CLINICAL DATA:  Respiratory failure. EXAM: PORTABLE CHEST 1 VIEW COMPARISON:  Chest x-ray 02/22/2021, CT chest 01/11/2021 FINDINGS: Tracheostomy in similar position. The heart size and mediastinal contours are unchanged. Cardiac surgical changes overlie the mediastinum. Aortic calcification. Persistent right to left mediastinal shift. Similar-appearing right lower lung zone airspace opacity and likely trace to small pleural effusion. Similar-appearing left lower lung zone airspace opacity with associated atelectasis. Superimposed left pleural effusion not excluded. No acute osseous abnormality. IMPRESSION: Unchanged findings of right lower lung zone airspace opacity and likely trace to small volume pleural effusion. Unchanged left lower lung zone airspace opacity and associated atelectasis with superimposed left pleural effusion not excluded. Persistent right to left mediastinal shift due to left lung volume loss. Electronically Signed   By: Iven Finn M.D.   On: 02/24/2021 05:23    Assessment/Plan Active Problems:   Acute on chronic respiratory failure with hypoxia (HCC)   End stage COPD (HCC)   Pneumonia due to Moraxella catarrhalis (HCC)   Chronic  kidney disease, stage III (moderate) (HCC)   Acute on chronic respiratory failure hypoxia slow improvement continue to encourage compliance with BiPAP End-stage COPD severe disease we will continue to monitor Pneumonia due to Moraxella continue with present management supportive care Chronic kidney disease stage III following labs   I have personally seen and evaluated the patient, evaluated  laboratory and imaging results, formulated the assessment and plan and placed orders. The Patient requires high complexity decision making with multiple systems involvement.  Rounds were done with the Respiratory Therapy Director and Staff therapists and discussed with nursing staff also.  Allyne Gee, MD [facility] Pulmonary Critical Care Medicine Sleep Medicine

## 2021-02-26 LAB — PROTIME-INR
INR: 2.5 — ABNORMAL HIGH (ref 0.8–1.2)
Prothrombin Time: 26.6 seconds — ABNORMAL HIGH (ref 11.4–15.2)

## 2021-02-27 LAB — PROTIME-INR
INR: 2.9 — ABNORMAL HIGH (ref 0.8–1.2)
Prothrombin Time: 29.9 seconds — ABNORMAL HIGH (ref 11.4–15.2)

## 2021-02-28 LAB — PROTIME-INR
INR: 3.3 — ABNORMAL HIGH (ref 0.8–1.2)
Prothrombin Time: 33.5 seconds — ABNORMAL HIGH (ref 11.4–15.2)

## 2021-02-28 LAB — RENAL FUNCTION PANEL
Albumin: 2.7 g/dL — ABNORMAL LOW (ref 3.5–5.0)
Anion gap: 10 (ref 5–15)
BUN: 93 mg/dL — ABNORMAL HIGH (ref 6–20)
CO2: 32 mmol/L (ref 22–32)
Calcium: 9 mg/dL (ref 8.9–10.3)
Chloride: 97 mmol/L — ABNORMAL LOW (ref 98–111)
Creatinine, Ser: 3.55 mg/dL — ABNORMAL HIGH (ref 0.61–1.24)
GFR, Estimated: 19 mL/min — ABNORMAL LOW (ref >60)
Glucose, Bld: 110 mg/dL — ABNORMAL HIGH (ref 70–99)
Phosphorus: 3.9 mg/dL (ref 2.5–4.6)
Potassium: 4.8 mmol/L (ref 3.5–5.1)
Sodium: 139 mmol/L (ref 135–145)

## 2021-02-28 LAB — CBC
HCT: 30.4 % — ABNORMAL LOW (ref 39.0–52.0)
Hemoglobin: 9.8 g/dL — ABNORMAL LOW (ref 13.0–17.0)
MCH: 32.3 pg (ref 26.0–34.0)
MCHC: 32.2 g/dL (ref 30.0–36.0)
MCV: 100.3 fL — ABNORMAL HIGH (ref 80.0–100.0)
Platelets: 205 10*3/uL (ref 150–400)
RBC: 3.03 MIL/uL — ABNORMAL LOW (ref 4.22–5.81)
RDW: 15 % (ref 11.5–15.5)
WBC: 8.1 10*3/uL (ref 4.0–10.5)
nRBC: 0 % (ref 0.0–0.2)

## 2021-02-28 LAB — MAGNESIUM: Magnesium: 2.3 mg/dL (ref 1.7–2.4)

## 2021-03-01 LAB — PROTIME-INR
INR: 4.2 (ref 0.8–1.2)
Prothrombin Time: 40.2 seconds — ABNORMAL HIGH (ref 11.4–15.2)

## 2021-03-01 NOTE — Progress Notes (Signed)
Pulmonary Critical Care Medicine [place]   PULMONARY CRITICAL CARE SERVICE  PROGRESS NOTE     STEFFAN CANIGLIA  PYK:[id number]  DOB: December 22, 1960   DOA: 01/28/2021  Referring Physician: Merton Border, MD  HPI: Jackson Hahn is a 60 y.o. male being followed for ventilator/airway/oxygen weaning Acute on Chronic Respiratory Failure.  Patient is capping on 6 L doing very well he is now using the BiPAP secretions are minimal he does have a good strong cough and is able to get his secretions up to his throat  Medications: Reviewed on Rounds  Physical Exam:  Vitals: Temperature is 97.1 pulse 92 respiratory rate is 18 blood pressure 101/52 saturations 97%  Ventilator Settings capping on 6 L  General: Comfortable at this time Neck: supple Cardiovascular: no malignant arrhythmias Respiratory: No rhonchi no rales are noted at this time Skin: no rash seen on limited exam Musculoskeletal: No gross abnormality Psychiatric:unable to assess Neurologic:no involuntary movements         Lab Data:   Basic Metabolic Panel: Recent Labs  Lab 02/23/21 0431 02/24/21 0348 02/28/21 0746  NA 137 138 139  K 4.4 4.5 4.8  CL 96* 93* 97*  CO2 34* 36* 32  GLUCOSE 132* 118* 110*  BUN 115* 119* 93*  CREATININE 3.19* 3.19* 3.55*  CALCIUM 9.9 10.2 9.0  MG  --  2.4 2.3  PHOS  --  4.5 3.9    ABG: Recent Labs  Lab 02/23/21 1430  PHART 7.321*  PCO2ART 67.6*  PO2ART 62.4*  HCO3 34.4*  O2SAT 93.4    Liver Function Tests: Recent Labs  Lab 02/24/21 0348 02/28/21 0746  ALBUMIN 2.5* 2.7*   No results for input(s): LIPASE, AMYLASE in the last 168 hours. No results for input(s): AMMONIA in the last 168 hours.  CBC: Recent Labs  Lab 02/22/21 1426 02/24/21 0348 02/25/21 1323 02/28/21 0746  WBC 9.5 8.0 8.7 8.1  HGB 9.2* 9.2* 9.2* 9.8*  HCT 29.5* 29.5* 28.5* 30.4*  MCV 102.4* 101.7* 100.0 100.3*  PLT 187 184 202 205    Cardiac Enzymes: No results for  input(s): CKTOTAL, CKMB, CKMBINDEX, TROPONINI in the last 168 hours.  BNP (last 3 results) No results for input(s): BNP in the last 8760 hours.  ProBNP (last 3 results) No results for input(s): PROBNP in the last 8760 hours.  Radiological Exams: No results found.  Assessment/Plan Active Problems:   Acute on chronic respiratory failure with hypoxia (HCC)   End stage COPD (HCC)   Pneumonia due to Moraxella catarrhalis (HCC)   Chronic kidney disease, stage III (moderate) (HCC)   Acute on chronic respiratory failure hypoxia we will proceed to decannulation doing well he is now compliant with BiPAP states that he will stay compliant.  I did have a rather lengthy discussion with him regarding what to expect after decannulation.  Told him that there is still a chance that he might not do well and that he could end up having to go back on mechanical ventilation.  I also explained to him that if his stoma were to close then it would require him to be intubated.  He stated that he is willing to take that risk and understands. End-stage COPD supportive care we will continue to monitor closely Pneumonia due to Moraxella has been treated improving Chronic kidney disease stage III we will continue with supportive care   I have personally seen and evaluated the patient, evaluated laboratory and imaging results, formulated the assessment and  plan and placed orders. The Patient requires high complexity decision making with multiple systems involvement.  Rounds were done with the Respiratory Therapy Director and Staff therapists and discussed with nursing staff also.  Allyne Gee, MD [facility] Pulmonary Critical Care Medicine Sleep Medicine

## 2021-03-02 LAB — CBC
HCT: 29.5 % — ABNORMAL LOW (ref 39.0–52.0)
Hemoglobin: 9.7 g/dL — ABNORMAL LOW (ref 13.0–17.0)
MCH: 32.7 pg (ref 26.0–34.0)
MCHC: 32.9 g/dL (ref 30.0–36.0)
MCV: 99.3 fL (ref 80.0–100.0)
Platelets: 190 10*3/uL (ref 150–400)
RBC: 2.97 MIL/uL — ABNORMAL LOW (ref 4.22–5.81)
RDW: 15.2 % (ref 11.5–15.5)
WBC: 7.4 10*3/uL (ref 4.0–10.5)
nRBC: 0 % (ref 0.0–0.2)

## 2021-03-02 LAB — RENAL FUNCTION PANEL
Albumin: 2.7 g/dL — ABNORMAL LOW (ref 3.5–5.0)
Anion gap: 10 (ref 5–15)
BUN: 90 mg/dL — ABNORMAL HIGH (ref 6–20)
CO2: 30 mmol/L (ref 22–32)
Calcium: 8.6 mg/dL — ABNORMAL LOW (ref 8.9–10.3)
Chloride: 99 mmol/L (ref 98–111)
Creatinine, Ser: 4.01 mg/dL — ABNORMAL HIGH (ref 0.61–1.24)
GFR, Estimated: 16 mL/min — ABNORMAL LOW (ref >60)
Glucose, Bld: 99 mg/dL (ref 70–99)
Phosphorus: 5.7 mg/dL — ABNORMAL HIGH (ref 2.5–4.6)
Potassium: 4.4 mmol/L (ref 3.5–5.1)
Sodium: 139 mmol/L (ref 135–145)

## 2021-03-02 LAB — PROTIME-INR
INR: 3.6 — ABNORMAL HIGH (ref 0.8–1.2)
Prothrombin Time: 35.7 seconds — ABNORMAL HIGH (ref 11.4–15.2)

## 2021-03-03 LAB — PROTIME-INR
INR: 3.2 — ABNORMAL HIGH (ref 0.8–1.2)
Prothrombin Time: 33.1 seconds — ABNORMAL HIGH (ref 11.4–15.2)

## 2021-03-04 ENCOUNTER — Other Ambulatory Visit (HOSPITAL_COMMUNITY): Payer: Medicare Other

## 2021-03-04 LAB — RENAL FUNCTION PANEL
Albumin: 2.5 g/dL — ABNORMAL LOW (ref 3.5–5.0)
Anion gap: 11 (ref 5–15)
BUN: 95 mg/dL — ABNORMAL HIGH (ref 6–20)
CO2: 31 mmol/L (ref 22–32)
Calcium: 8.5 mg/dL — ABNORMAL LOW (ref 8.9–10.3)
Chloride: 99 mmol/L (ref 98–111)
Creatinine, Ser: 4.14 mg/dL — ABNORMAL HIGH (ref 0.61–1.24)
GFR, Estimated: 16 mL/min — ABNORMAL LOW (ref >60)
Glucose, Bld: 98 mg/dL (ref 70–99)
Phosphorus: 5.7 mg/dL — ABNORMAL HIGH (ref 2.5–4.6)
Potassium: 4.1 mmol/L (ref 3.5–5.1)
Sodium: 141 mmol/L (ref 135–145)

## 2021-03-04 LAB — CBC
HCT: 27.1 % — ABNORMAL LOW (ref 39.0–52.0)
Hemoglobin: 8.7 g/dL — ABNORMAL LOW (ref 13.0–17.0)
MCH: 32 pg (ref 26.0–34.0)
MCHC: 32.1 g/dL (ref 30.0–36.0)
MCV: 99.6 fL (ref 80.0–100.0)
Platelets: 170 10*3/uL (ref 150–400)
RBC: 2.72 MIL/uL — ABNORMAL LOW (ref 4.22–5.81)
RDW: 15.4 % (ref 11.5–15.5)
WBC: 6.7 10*3/uL (ref 4.0–10.5)
nRBC: 0 % (ref 0.0–0.2)

## 2021-03-04 LAB — PROTEIN / CREATININE RATIO, URINE
Creatinine, Urine: 79.72 mg/dL
Protein Creatinine Ratio: 0.61 mg/mg{Cre} — ABNORMAL HIGH (ref 0.00–0.15)
Total Protein, Urine: 49 mg/dL

## 2021-03-04 LAB — URINALYSIS, ROUTINE W REFLEX MICROSCOPIC
Bilirubin Urine: NEGATIVE
Glucose, UA: NEGATIVE mg/dL
Hgb urine dipstick: NEGATIVE
Ketones, ur: NEGATIVE mg/dL
Nitrite: NEGATIVE
Protein, ur: 30 mg/dL — AB
Specific Gravity, Urine: 1.011 (ref 1.005–1.030)
pH: 5 (ref 5.0–8.0)

## 2021-03-04 LAB — PROTIME-INR
INR: 2.7 — ABNORMAL HIGH (ref 0.8–1.2)
Prothrombin Time: 29 seconds — ABNORMAL HIGH (ref 11.4–15.2)

## 2021-03-05 LAB — PROTIME-INR
INR: 2.8 — ABNORMAL HIGH (ref 0.8–1.2)
Prothrombin Time: 29.5 seconds — ABNORMAL HIGH (ref 11.4–15.2)

## 2021-03-06 LAB — PROTIME-INR
INR: 2.8 — ABNORMAL HIGH (ref 0.8–1.2)
Prothrombin Time: 29.2 seconds — ABNORMAL HIGH (ref 11.4–15.2)

## 2021-03-06 LAB — RENAL FUNCTION PANEL
Albumin: 2.7 g/dL — ABNORMAL LOW (ref 3.5–5.0)
Anion gap: 6 (ref 5–15)
BUN: 79 mg/dL — ABNORMAL HIGH (ref 6–20)
CO2: 28 mmol/L (ref 22–32)
Calcium: 9 mg/dL (ref 8.9–10.3)
Chloride: 105 mmol/L (ref 98–111)
Creatinine, Ser: 3.44 mg/dL — ABNORMAL HIGH (ref 0.61–1.24)
GFR, Estimated: 20 mL/min — ABNORMAL LOW (ref >60)
Glucose, Bld: 143 mg/dL — ABNORMAL HIGH (ref 70–99)
Phosphorus: 4.8 mg/dL — ABNORMAL HIGH (ref 2.5–4.6)
Potassium: 4.3 mmol/L (ref 3.5–5.1)
Sodium: 139 mmol/L (ref 135–145)

## 2021-03-07 LAB — PROTIME-INR
INR: 2.9 — ABNORMAL HIGH (ref 0.8–1.2)
Prothrombin Time: 30.1 seconds — ABNORMAL HIGH (ref 11.4–15.2)

## 2021-03-08 LAB — PROTIME-INR
INR: 2.8 — ABNORMAL HIGH (ref 0.8–1.2)
Prothrombin Time: 29.3 seconds — ABNORMAL HIGH (ref 11.4–15.2)

## 2021-03-16 DIAGNOSIS — Z7901 Long term (current) use of anticoagulants: Secondary | ICD-10-CM | POA: Diagnosis not present

## 2021-03-16 DIAGNOSIS — Z931 Gastrostomy status: Secondary | ICD-10-CM | POA: Diagnosis not present

## 2021-03-16 DIAGNOSIS — Z09 Encounter for follow-up examination after completed treatment for conditions other than malignant neoplasm: Secondary | ICD-10-CM | POA: Diagnosis not present

## 2021-03-23 DIAGNOSIS — Z7901 Long term (current) use of anticoagulants: Secondary | ICD-10-CM | POA: Diagnosis not present

## 2021-03-25 DIAGNOSIS — Z1331 Encounter for screening for depression: Secondary | ICD-10-CM | POA: Diagnosis not present

## 2021-03-25 DIAGNOSIS — Z9181 History of falling: Secondary | ICD-10-CM | POA: Diagnosis not present

## 2021-03-25 DIAGNOSIS — Z0000 Encounter for general adult medical examination without abnormal findings: Secondary | ICD-10-CM | POA: Diagnosis not present

## 2021-03-25 DIAGNOSIS — E785 Hyperlipidemia, unspecified: Secondary | ICD-10-CM | POA: Diagnosis not present

## 2021-03-29 DIAGNOSIS — R0602 Shortness of breath: Secondary | ICD-10-CM | POA: Diagnosis not present

## 2021-03-29 DIAGNOSIS — Z9889 Other specified postprocedural states: Secondary | ICD-10-CM | POA: Diagnosis not present

## 2021-03-29 DIAGNOSIS — J449 Chronic obstructive pulmonary disease, unspecified: Secondary | ICD-10-CM | POA: Diagnosis not present

## 2021-03-29 DIAGNOSIS — Z931 Gastrostomy status: Secondary | ICD-10-CM | POA: Diagnosis not present

## 2021-04-01 DIAGNOSIS — Z431 Encounter for attention to gastrostomy: Secondary | ICD-10-CM | POA: Diagnosis not present

## 2021-04-01 DIAGNOSIS — Z7901 Long term (current) use of anticoagulants: Secondary | ICD-10-CM | POA: Diagnosis not present

## 2021-04-29 DIAGNOSIS — Z952 Presence of prosthetic heart valve: Secondary | ICD-10-CM | POA: Diagnosis not present

## 2021-04-29 DIAGNOSIS — J449 Chronic obstructive pulmonary disease, unspecified: Secondary | ICD-10-CM | POA: Diagnosis not present

## 2021-04-29 DIAGNOSIS — Z7901 Long term (current) use of anticoagulants: Secondary | ICD-10-CM | POA: Diagnosis not present

## 2021-04-29 DIAGNOSIS — Z6835 Body mass index (BMI) 35.0-35.9, adult: Secondary | ICD-10-CM | POA: Diagnosis not present

## 2021-04-29 DIAGNOSIS — I5042 Chronic combined systolic (congestive) and diastolic (congestive) heart failure: Secondary | ICD-10-CM | POA: Diagnosis not present

## 2021-04-29 DIAGNOSIS — M791 Myalgia, unspecified site: Secondary | ICD-10-CM | POA: Diagnosis not present

## 2021-04-29 DIAGNOSIS — J9611 Chronic respiratory failure with hypoxia: Secondary | ICD-10-CM | POA: Diagnosis not present

## 2021-04-29 DIAGNOSIS — T466X5A Adverse effect of antihyperlipidemic and antiarteriosclerotic drugs, initial encounter: Secondary | ICD-10-CM | POA: Diagnosis not present

## 2021-05-10 DIAGNOSIS — J449 Chronic obstructive pulmonary disease, unspecified: Secondary | ICD-10-CM | POA: Diagnosis not present

## 2021-05-10 DIAGNOSIS — R0602 Shortness of breath: Secondary | ICD-10-CM | POA: Diagnosis not present

## 2021-06-01 DIAGNOSIS — I5042 Chronic combined systolic (congestive) and diastolic (congestive) heart failure: Secondary | ICD-10-CM | POA: Diagnosis not present

## 2021-06-01 DIAGNOSIS — Z79899 Other long term (current) drug therapy: Secondary | ICD-10-CM | POA: Diagnosis not present

## 2021-06-01 DIAGNOSIS — E782 Mixed hyperlipidemia: Secondary | ICD-10-CM | POA: Diagnosis not present

## 2021-06-01 DIAGNOSIS — Z6835 Body mass index (BMI) 35.0-35.9, adult: Secondary | ICD-10-CM | POA: Diagnosis not present

## 2021-06-01 DIAGNOSIS — Z7901 Long term (current) use of anticoagulants: Secondary | ICD-10-CM | POA: Diagnosis not present

## 2021-06-01 DIAGNOSIS — Z125 Encounter for screening for malignant neoplasm of prostate: Secondary | ICD-10-CM | POA: Diagnosis not present

## 2021-06-01 DIAGNOSIS — E559 Vitamin D deficiency, unspecified: Secondary | ICD-10-CM | POA: Diagnosis not present

## 2021-06-01 DIAGNOSIS — J449 Chronic obstructive pulmonary disease, unspecified: Secondary | ICD-10-CM | POA: Diagnosis not present

## 2021-06-01 DIAGNOSIS — J9611 Chronic respiratory failure with hypoxia: Secondary | ICD-10-CM | POA: Diagnosis not present

## 2021-06-01 DIAGNOSIS — N1831 Chronic kidney disease, stage 3a: Secondary | ICD-10-CM | POA: Diagnosis not present

## 2021-06-21 DIAGNOSIS — N1832 Chronic kidney disease, stage 3b: Secondary | ICD-10-CM | POA: Diagnosis not present

## 2021-06-22 DIAGNOSIS — Z720 Tobacco use: Secondary | ICD-10-CM | POA: Diagnosis not present

## 2021-06-22 DIAGNOSIS — J9611 Chronic respiratory failure with hypoxia: Secondary | ICD-10-CM | POA: Diagnosis not present

## 2021-06-22 DIAGNOSIS — E669 Obesity, unspecified: Secondary | ICD-10-CM | POA: Diagnosis not present

## 2021-06-22 DIAGNOSIS — J9612 Chronic respiratory failure with hypercapnia: Secondary | ICD-10-CM | POA: Diagnosis not present

## 2021-06-22 DIAGNOSIS — N184 Chronic kidney disease, stage 4 (severe): Secondary | ICD-10-CM | POA: Diagnosis not present

## 2021-07-01 DIAGNOSIS — Z7901 Long term (current) use of anticoagulants: Secondary | ICD-10-CM | POA: Diagnosis not present

## 2021-07-01 DIAGNOSIS — T466X5A Adverse effect of antihyperlipidemic and antiarteriosclerotic drugs, initial encounter: Secondary | ICD-10-CM | POA: Diagnosis not present

## 2021-07-01 DIAGNOSIS — J9611 Chronic respiratory failure with hypoxia: Secondary | ICD-10-CM | POA: Diagnosis not present

## 2021-07-01 DIAGNOSIS — N1832 Chronic kidney disease, stage 3b: Secondary | ICD-10-CM | POA: Diagnosis not present

## 2021-07-01 DIAGNOSIS — J449 Chronic obstructive pulmonary disease, unspecified: Secondary | ICD-10-CM | POA: Diagnosis not present

## 2021-07-01 DIAGNOSIS — Z6835 Body mass index (BMI) 35.0-35.9, adult: Secondary | ICD-10-CM | POA: Diagnosis not present

## 2021-07-01 DIAGNOSIS — I5042 Chronic combined systolic (congestive) and diastolic (congestive) heart failure: Secondary | ICD-10-CM | POA: Diagnosis not present

## 2021-07-01 DIAGNOSIS — E782 Mixed hyperlipidemia: Secondary | ICD-10-CM | POA: Diagnosis not present

## 2021-07-31 DIAGNOSIS — F17200 Nicotine dependence, unspecified, uncomplicated: Secondary | ICD-10-CM | POA: Diagnosis not present

## 2021-07-31 DIAGNOSIS — F17210 Nicotine dependence, cigarettes, uncomplicated: Secondary | ICD-10-CM | POA: Diagnosis present

## 2021-07-31 DIAGNOSIS — I429 Cardiomyopathy, unspecified: Secondary | ICD-10-CM | POA: Diagnosis not present

## 2021-07-31 DIAGNOSIS — R0603 Acute respiratory distress: Secondary | ICD-10-CM | POA: Diagnosis not present

## 2021-07-31 DIAGNOSIS — J440 Chronic obstructive pulmonary disease with acute lower respiratory infection: Secondary | ICD-10-CM | POA: Diagnosis not present

## 2021-07-31 DIAGNOSIS — I714 Abdominal aortic aneurysm, without rupture, unspecified: Secondary | ICD-10-CM | POA: Diagnosis not present

## 2021-07-31 DIAGNOSIS — I2510 Atherosclerotic heart disease of native coronary artery without angina pectoris: Secondary | ICD-10-CM | POA: Diagnosis present

## 2021-07-31 DIAGNOSIS — J181 Lobar pneumonia, unspecified organism: Secondary | ICD-10-CM | POA: Diagnosis not present

## 2021-07-31 DIAGNOSIS — I447 Left bundle-branch block, unspecified: Secondary | ICD-10-CM | POA: Diagnosis not present

## 2021-07-31 DIAGNOSIS — D539 Nutritional anemia, unspecified: Secondary | ICD-10-CM | POA: Diagnosis not present

## 2021-07-31 DIAGNOSIS — N179 Acute kidney failure, unspecified: Secondary | ICD-10-CM | POA: Diagnosis present

## 2021-07-31 DIAGNOSIS — I7121 Aneurysm of the ascending aorta, without rupture: Secondary | ICD-10-CM | POA: Diagnosis present

## 2021-07-31 DIAGNOSIS — D638 Anemia in other chronic diseases classified elsewhere: Secondary | ICD-10-CM | POA: Diagnosis present

## 2021-07-31 DIAGNOSIS — J449 Chronic obstructive pulmonary disease, unspecified: Secondary | ICD-10-CM | POA: Diagnosis not present

## 2021-07-31 DIAGNOSIS — T380X5A Adverse effect of glucocorticoids and synthetic analogues, initial encounter: Secondary | ICD-10-CM | POA: Diagnosis present

## 2021-07-31 DIAGNOSIS — R000 Tachycardia, unspecified: Secondary | ICD-10-CM | POA: Diagnosis not present

## 2021-07-31 DIAGNOSIS — I129 Hypertensive chronic kidney disease with stage 1 through stage 4 chronic kidney disease, or unspecified chronic kidney disease: Secondary | ICD-10-CM | POA: Diagnosis not present

## 2021-07-31 DIAGNOSIS — J189 Pneumonia, unspecified organism: Secondary | ICD-10-CM | POA: Diagnosis not present

## 2021-07-31 DIAGNOSIS — J441 Chronic obstructive pulmonary disease with (acute) exacerbation: Secondary | ICD-10-CM | POA: Diagnosis present

## 2021-07-31 DIAGNOSIS — Z91199 Patient's noncompliance with other medical treatment and regimen due to unspecified reason: Secondary | ICD-10-CM | POA: Diagnosis not present

## 2021-07-31 DIAGNOSIS — N189 Chronic kidney disease, unspecified: Secondary | ICD-10-CM | POA: Diagnosis not present

## 2021-07-31 DIAGNOSIS — Z930 Tracheostomy status: Secondary | ICD-10-CM | POA: Diagnosis not present

## 2021-07-31 DIAGNOSIS — E875 Hyperkalemia: Secondary | ICD-10-CM | POA: Diagnosis present

## 2021-07-31 DIAGNOSIS — I700 Atherosclerosis of aorta: Secondary | ICD-10-CM | POA: Diagnosis not present

## 2021-07-31 DIAGNOSIS — Z79899 Other long term (current) drug therapy: Secondary | ICD-10-CM | POA: Diagnosis not present

## 2021-07-31 DIAGNOSIS — I130 Hypertensive heart and chronic kidney disease with heart failure and stage 1 through stage 4 chronic kidney disease, or unspecified chronic kidney disease: Secondary | ICD-10-CM | POA: Diagnosis not present

## 2021-07-31 DIAGNOSIS — Z954 Presence of other heart-valve replacement: Secondary | ICD-10-CM | POA: Diagnosis not present

## 2021-07-31 DIAGNOSIS — I509 Heart failure, unspecified: Secondary | ICD-10-CM | POA: Diagnosis not present

## 2021-07-31 DIAGNOSIS — I252 Old myocardial infarction: Secondary | ICD-10-CM | POA: Diagnosis not present

## 2021-07-31 DIAGNOSIS — Z9981 Dependence on supplemental oxygen: Secondary | ICD-10-CM | POA: Diagnosis not present

## 2021-07-31 DIAGNOSIS — N183 Chronic kidney disease, stage 3 unspecified: Secondary | ICD-10-CM | POA: Diagnosis not present

## 2021-07-31 DIAGNOSIS — I5032 Chronic diastolic (congestive) heart failure: Secondary | ICD-10-CM | POA: Diagnosis present

## 2021-07-31 DIAGNOSIS — I493 Ventricular premature depolarization: Secondary | ICD-10-CM | POA: Diagnosis present

## 2021-07-31 DIAGNOSIS — D696 Thrombocytopenia, unspecified: Secondary | ICD-10-CM | POA: Diagnosis present

## 2021-07-31 DIAGNOSIS — N185 Chronic kidney disease, stage 5: Secondary | ICD-10-CM | POA: Diagnosis not present

## 2021-07-31 DIAGNOSIS — E785 Hyperlipidemia, unspecified: Secondary | ICD-10-CM | POA: Diagnosis present

## 2021-07-31 DIAGNOSIS — Z952 Presence of prosthetic heart valve: Secondary | ICD-10-CM | POA: Diagnosis not present

## 2021-07-31 DIAGNOSIS — J9622 Acute and chronic respiratory failure with hypercapnia: Secondary | ICD-10-CM | POA: Diagnosis not present

## 2021-07-31 DIAGNOSIS — E878 Other disorders of electrolyte and fluid balance, not elsewhere classified: Secondary | ICD-10-CM | POA: Diagnosis not present

## 2021-07-31 DIAGNOSIS — Z7982 Long term (current) use of aspirin: Secondary | ICD-10-CM | POA: Diagnosis not present

## 2021-07-31 DIAGNOSIS — I5020 Unspecified systolic (congestive) heart failure: Secondary | ICD-10-CM | POA: Diagnosis not present

## 2021-07-31 DIAGNOSIS — D631 Anemia in chronic kidney disease: Secondary | ICD-10-CM | POA: Diagnosis not present

## 2021-07-31 DIAGNOSIS — D649 Anemia, unspecified: Secondary | ICD-10-CM | POA: Diagnosis not present

## 2021-07-31 DIAGNOSIS — Z7901 Long term (current) use of anticoagulants: Secondary | ICD-10-CM | POA: Diagnosis not present

## 2021-07-31 DIAGNOSIS — J9621 Acute and chronic respiratory failure with hypoxia: Secondary | ICD-10-CM | POA: Diagnosis not present

## 2021-07-31 DIAGNOSIS — Z20822 Contact with and (suspected) exposure to covid-19: Secondary | ICD-10-CM | POA: Diagnosis not present

## 2021-07-31 DIAGNOSIS — R0602 Shortness of breath: Secondary | ICD-10-CM | POA: Diagnosis not present

## 2021-07-31 DIAGNOSIS — N184 Chronic kidney disease, stage 4 (severe): Secondary | ICD-10-CM | POA: Diagnosis present

## 2021-07-31 DIAGNOSIS — J9601 Acute respiratory failure with hypoxia: Secondary | ICD-10-CM | POA: Diagnosis not present

## 2021-07-31 DIAGNOSIS — G9341 Metabolic encephalopathy: Secondary | ICD-10-CM | POA: Diagnosis not present

## 2021-08-06 DIAGNOSIS — J9622 Acute and chronic respiratory failure with hypercapnia: Secondary | ICD-10-CM | POA: Diagnosis not present

## 2021-08-06 DIAGNOSIS — J441 Chronic obstructive pulmonary disease with (acute) exacerbation: Secondary | ICD-10-CM | POA: Diagnosis not present

## 2021-08-06 DIAGNOSIS — Z952 Presence of prosthetic heart valve: Secondary | ICD-10-CM | POA: Diagnosis not present

## 2021-08-06 DIAGNOSIS — J9621 Acute and chronic respiratory failure with hypoxia: Secondary | ICD-10-CM | POA: Diagnosis not present

## 2021-08-06 DIAGNOSIS — I5022 Chronic systolic (congestive) heart failure: Secondary | ICD-10-CM | POA: Diagnosis not present

## 2021-08-06 DIAGNOSIS — N179 Acute kidney failure, unspecified: Secondary | ICD-10-CM | POA: Diagnosis not present

## 2021-08-06 DIAGNOSIS — I130 Hypertensive heart and chronic kidney disease with heart failure and stage 1 through stage 4 chronic kidney disease, or unspecified chronic kidney disease: Secondary | ICD-10-CM | POA: Diagnosis not present

## 2021-08-06 DIAGNOSIS — Z720 Tobacco use: Secondary | ICD-10-CM | POA: Diagnosis not present

## 2021-08-06 DIAGNOSIS — N184 Chronic kidney disease, stage 4 (severe): Secondary | ICD-10-CM | POA: Diagnosis not present

## 2021-08-06 DIAGNOSIS — Z9981 Dependence on supplemental oxygen: Secondary | ICD-10-CM | POA: Diagnosis not present

## 2021-08-06 DIAGNOSIS — Z7901 Long term (current) use of anticoagulants: Secondary | ICD-10-CM | POA: Diagnosis not present

## 2021-08-08 DIAGNOSIS — Z952 Presence of prosthetic heart valve: Secondary | ICD-10-CM | POA: Diagnosis not present

## 2021-08-08 DIAGNOSIS — N1832 Chronic kidney disease, stage 3b: Secondary | ICD-10-CM | POA: Diagnosis not present

## 2021-08-08 DIAGNOSIS — N179 Acute kidney failure, unspecified: Secondary | ICD-10-CM | POA: Diagnosis not present

## 2021-08-08 DIAGNOSIS — J441 Chronic obstructive pulmonary disease with (acute) exacerbation: Secondary | ICD-10-CM | POA: Diagnosis not present

## 2021-08-08 DIAGNOSIS — J9621 Acute and chronic respiratory failure with hypoxia: Secondary | ICD-10-CM | POA: Diagnosis not present

## 2021-08-08 DIAGNOSIS — I2510 Atherosclerotic heart disease of native coronary artery without angina pectoris: Secondary | ICD-10-CM | POA: Diagnosis not present

## 2021-08-08 DIAGNOSIS — I130 Hypertensive heart and chronic kidney disease with heart failure and stage 1 through stage 4 chronic kidney disease, or unspecified chronic kidney disease: Secondary | ICD-10-CM | POA: Diagnosis not present

## 2021-08-08 DIAGNOSIS — F17210 Nicotine dependence, cigarettes, uncomplicated: Secondary | ICD-10-CM | POA: Diagnosis not present

## 2021-08-08 DIAGNOSIS — I5042 Chronic combined systolic (congestive) and diastolic (congestive) heart failure: Secondary | ICD-10-CM | POA: Diagnosis not present

## 2021-08-10 DIAGNOSIS — Z7901 Long term (current) use of anticoagulants: Secondary | ICD-10-CM | POA: Diagnosis not present

## 2021-08-15 DIAGNOSIS — J9611 Chronic respiratory failure with hypoxia: Secondary | ICD-10-CM | POA: Diagnosis not present

## 2021-08-15 DIAGNOSIS — N1832 Chronic kidney disease, stage 3b: Secondary | ICD-10-CM | POA: Diagnosis not present

## 2021-08-15 DIAGNOSIS — J449 Chronic obstructive pulmonary disease, unspecified: Secondary | ICD-10-CM | POA: Diagnosis not present

## 2021-08-15 DIAGNOSIS — Z6835 Body mass index (BMI) 35.0-35.9, adult: Secondary | ICD-10-CM | POA: Diagnosis not present

## 2021-08-15 DIAGNOSIS — I5042 Chronic combined systolic (congestive) and diastolic (congestive) heart failure: Secondary | ICD-10-CM | POA: Diagnosis not present

## 2021-08-15 DIAGNOSIS — Z79899 Other long term (current) drug therapy: Secondary | ICD-10-CM | POA: Diagnosis not present

## 2021-08-15 DIAGNOSIS — Z7901 Long term (current) use of anticoagulants: Secondary | ICD-10-CM | POA: Diagnosis not present

## 2021-08-25 DIAGNOSIS — Z7901 Long term (current) use of anticoagulants: Secondary | ICD-10-CM | POA: Diagnosis not present

## 2021-08-30 DIAGNOSIS — Z7901 Long term (current) use of anticoagulants: Secondary | ICD-10-CM | POA: Diagnosis not present

## 2021-09-02 DIAGNOSIS — J449 Chronic obstructive pulmonary disease, unspecified: Secondary | ICD-10-CM | POA: Diagnosis not present

## 2021-09-02 DIAGNOSIS — E782 Mixed hyperlipidemia: Secondary | ICD-10-CM | POA: Diagnosis not present

## 2021-09-06 DIAGNOSIS — Z7901 Long term (current) use of anticoagulants: Secondary | ICD-10-CM | POA: Diagnosis not present

## 2021-09-08 DIAGNOSIS — I7121 Aneurysm of the ascending aorta, without rupture: Secondary | ICD-10-CM | POA: Diagnosis not present

## 2021-09-09 DIAGNOSIS — J449 Chronic obstructive pulmonary disease, unspecified: Secondary | ICD-10-CM | POA: Diagnosis not present

## 2021-09-09 DIAGNOSIS — J45909 Unspecified asthma, uncomplicated: Secondary | ICD-10-CM | POA: Diagnosis not present

## 2021-09-13 DIAGNOSIS — Z7901 Long term (current) use of anticoagulants: Secondary | ICD-10-CM | POA: Diagnosis not present

## 2021-09-26 DIAGNOSIS — R7989 Other specified abnormal findings of blood chemistry: Secondary | ICD-10-CM | POA: Diagnosis not present

## 2021-09-26 DIAGNOSIS — I471 Supraventricular tachycardia: Secondary | ICD-10-CM | POA: Diagnosis not present

## 2021-09-26 DIAGNOSIS — I493 Ventricular premature depolarization: Secondary | ICD-10-CM | POA: Diagnosis not present

## 2021-09-26 DIAGNOSIS — I10 Essential (primary) hypertension: Secondary | ICD-10-CM | POA: Diagnosis not present

## 2021-09-27 DIAGNOSIS — N184 Chronic kidney disease, stage 4 (severe): Secondary | ICD-10-CM | POA: Diagnosis not present

## 2021-09-28 DIAGNOSIS — J9612 Chronic respiratory failure with hypercapnia: Secondary | ICD-10-CM | POA: Diagnosis not present

## 2021-09-28 DIAGNOSIS — N184 Chronic kidney disease, stage 4 (severe): Secondary | ICD-10-CM | POA: Diagnosis not present

## 2021-09-28 DIAGNOSIS — J9611 Chronic respiratory failure with hypoxia: Secondary | ICD-10-CM | POA: Diagnosis not present

## 2021-09-28 DIAGNOSIS — E669 Obesity, unspecified: Secondary | ICD-10-CM | POA: Diagnosis not present

## 2021-09-28 DIAGNOSIS — Z720 Tobacco use: Secondary | ICD-10-CM | POA: Diagnosis not present

## 2021-09-29 DIAGNOSIS — Z7901 Long term (current) use of anticoagulants: Secondary | ICD-10-CM | POA: Diagnosis not present

## 2021-09-29 DIAGNOSIS — Z6835 Body mass index (BMI) 35.0-35.9, adult: Secondary | ICD-10-CM | POA: Diagnosis not present

## 2021-09-29 DIAGNOSIS — I471 Supraventricular tachycardia: Secondary | ICD-10-CM | POA: Diagnosis not present

## 2021-10-03 DIAGNOSIS — J811 Chronic pulmonary edema: Secondary | ICD-10-CM | POA: Diagnosis not present

## 2021-10-03 DIAGNOSIS — I493 Ventricular premature depolarization: Secondary | ICD-10-CM | POA: Diagnosis not present

## 2021-10-03 DIAGNOSIS — I509 Heart failure, unspecified: Secondary | ICD-10-CM | POA: Diagnosis not present

## 2021-10-03 DIAGNOSIS — E874 Mixed disorder of acid-base balance: Secondary | ICD-10-CM | POA: Diagnosis not present

## 2021-10-03 DIAGNOSIS — Z20822 Contact with and (suspected) exposure to covid-19: Secondary | ICD-10-CM | POA: Diagnosis not present

## 2021-10-03 DIAGNOSIS — J9621 Acute and chronic respiratory failure with hypoxia: Secondary | ICD-10-CM | POA: Diagnosis not present

## 2021-10-03 DIAGNOSIS — I429 Cardiomyopathy, unspecified: Secondary | ICD-10-CM | POA: Diagnosis not present

## 2021-10-03 DIAGNOSIS — I5020 Unspecified systolic (congestive) heart failure: Secondary | ICD-10-CM | POA: Diagnosis not present

## 2021-10-03 DIAGNOSIS — I5023 Acute on chronic systolic (congestive) heart failure: Secondary | ICD-10-CM | POA: Diagnosis not present

## 2021-10-03 DIAGNOSIS — N183 Chronic kidney disease, stage 3 unspecified: Secondary | ICD-10-CM | POA: Diagnosis not present

## 2021-10-03 DIAGNOSIS — I2510 Atherosclerotic heart disease of native coronary artery without angina pectoris: Secondary | ICD-10-CM | POA: Diagnosis not present

## 2021-10-03 DIAGNOSIS — Z7901 Long term (current) use of anticoagulants: Secondary | ICD-10-CM | POA: Diagnosis not present

## 2021-10-03 DIAGNOSIS — J441 Chronic obstructive pulmonary disease with (acute) exacerbation: Secondary | ICD-10-CM | POA: Diagnosis not present

## 2021-10-03 DIAGNOSIS — I130 Hypertensive heart and chronic kidney disease with heart failure and stage 1 through stage 4 chronic kidney disease, or unspecified chronic kidney disease: Secondary | ICD-10-CM | POA: Diagnosis not present

## 2021-10-03 DIAGNOSIS — I129 Hypertensive chronic kidney disease with stage 1 through stage 4 chronic kidney disease, or unspecified chronic kidney disease: Secondary | ICD-10-CM | POA: Diagnosis not present

## 2021-10-03 DIAGNOSIS — J9622 Acute and chronic respiratory failure with hypercapnia: Secondary | ICD-10-CM | POA: Diagnosis not present

## 2021-10-03 DIAGNOSIS — E1122 Type 2 diabetes mellitus with diabetic chronic kidney disease: Secondary | ICD-10-CM | POA: Diagnosis not present

## 2021-10-03 DIAGNOSIS — I110 Hypertensive heart disease with heart failure: Secondary | ICD-10-CM | POA: Diagnosis not present

## 2021-10-03 DIAGNOSIS — Z79899 Other long term (current) drug therapy: Secondary | ICD-10-CM | POA: Diagnosis not present

## 2021-10-03 DIAGNOSIS — Z7951 Long term (current) use of inhaled steroids: Secondary | ICD-10-CM | POA: Diagnosis not present

## 2021-10-03 DIAGNOSIS — B002 Herpesviral gingivostomatitis and pharyngotonsillitis: Secondary | ICD-10-CM | POA: Diagnosis not present

## 2021-10-03 DIAGNOSIS — J9602 Acute respiratory failure with hypercapnia: Secondary | ICD-10-CM | POA: Diagnosis not present

## 2021-10-03 DIAGNOSIS — E873 Alkalosis: Secondary | ICD-10-CM | POA: Diagnosis not present

## 2021-10-03 DIAGNOSIS — Z91199 Patient's noncompliance with other medical treatment and regimen due to unspecified reason: Secondary | ICD-10-CM | POA: Diagnosis not present

## 2021-10-03 DIAGNOSIS — B009 Herpesviral infection, unspecified: Secondary | ICD-10-CM | POA: Diagnosis not present

## 2021-10-03 DIAGNOSIS — Z8674 Personal history of sudden cardiac arrest: Secondary | ICD-10-CM | POA: Diagnosis not present

## 2021-10-03 DIAGNOSIS — I252 Old myocardial infarction: Secondary | ICD-10-CM | POA: Diagnosis not present

## 2021-10-03 DIAGNOSIS — R791 Abnormal coagulation profile: Secondary | ICD-10-CM | POA: Diagnosis present

## 2021-10-03 DIAGNOSIS — J449 Chronic obstructive pulmonary disease, unspecified: Secondary | ICD-10-CM | POA: Diagnosis not present

## 2021-10-03 DIAGNOSIS — Z952 Presence of prosthetic heart valve: Secondary | ICD-10-CM | POA: Diagnosis not present

## 2021-10-03 DIAGNOSIS — Z9981 Dependence on supplemental oxygen: Secondary | ICD-10-CM | POA: Diagnosis not present

## 2021-10-03 DIAGNOSIS — E875 Hyperkalemia: Secondary | ICD-10-CM | POA: Diagnosis present

## 2021-10-03 DIAGNOSIS — R0602 Shortness of breath: Secondary | ICD-10-CM | POA: Diagnosis not present

## 2021-10-03 DIAGNOSIS — J9601 Acute respiratory failure with hypoxia: Secondary | ICD-10-CM | POA: Diagnosis not present

## 2021-10-03 DIAGNOSIS — I517 Cardiomegaly: Secondary | ICD-10-CM | POA: Diagnosis not present

## 2021-10-12 DIAGNOSIS — Z954 Presence of other heart-valve replacement: Secondary | ICD-10-CM | POA: Diagnosis not present

## 2021-10-12 DIAGNOSIS — R791 Abnormal coagulation profile: Secondary | ICD-10-CM | POA: Diagnosis not present

## 2021-10-12 DIAGNOSIS — I2510 Atherosclerotic heart disease of native coronary artery without angina pectoris: Secondary | ICD-10-CM | POA: Diagnosis not present

## 2021-10-13 DIAGNOSIS — J449 Chronic obstructive pulmonary disease, unspecified: Secondary | ICD-10-CM | POA: Diagnosis not present

## 2021-10-13 DIAGNOSIS — N184 Chronic kidney disease, stage 4 (severe): Secondary | ICD-10-CM | POA: Diagnosis not present

## 2021-10-13 DIAGNOSIS — Z6836 Body mass index (BMI) 36.0-36.9, adult: Secondary | ICD-10-CM | POA: Diagnosis not present

## 2021-10-13 DIAGNOSIS — Z79899 Other long term (current) drug therapy: Secondary | ICD-10-CM | POA: Diagnosis not present

## 2021-10-13 DIAGNOSIS — I5042 Chronic combined systolic (congestive) and diastolic (congestive) heart failure: Secondary | ICD-10-CM | POA: Diagnosis not present

## 2021-10-13 DIAGNOSIS — R791 Abnormal coagulation profile: Secondary | ICD-10-CM | POA: Diagnosis not present

## 2021-10-24 DIAGNOSIS — G4730 Sleep apnea, unspecified: Secondary | ICD-10-CM | POA: Diagnosis not present

## 2021-10-24 DIAGNOSIS — Z7901 Long term (current) use of anticoagulants: Secondary | ICD-10-CM | POA: Diagnosis not present

## 2021-10-24 DIAGNOSIS — Z6836 Body mass index (BMI) 36.0-36.9, adult: Secondary | ICD-10-CM | POA: Diagnosis not present

## 2021-10-24 DIAGNOSIS — I5042 Chronic combined systolic (congestive) and diastolic (congestive) heart failure: Secondary | ICD-10-CM | POA: Diagnosis not present

## 2021-10-24 DIAGNOSIS — J9611 Chronic respiratory failure with hypoxia: Secondary | ICD-10-CM | POA: Diagnosis not present

## 2021-11-01 DIAGNOSIS — N184 Chronic kidney disease, stage 4 (severe): Secondary | ICD-10-CM | POA: Diagnosis not present

## 2021-11-01 DIAGNOSIS — J449 Chronic obstructive pulmonary disease, unspecified: Secondary | ICD-10-CM | POA: Diagnosis not present

## 2021-11-01 DIAGNOSIS — J9611 Chronic respiratory failure with hypoxia: Secondary | ICD-10-CM | POA: Diagnosis not present

## 2021-11-09 DIAGNOSIS — N184 Chronic kidney disease, stage 4 (severe): Secondary | ICD-10-CM | POA: Diagnosis not present

## 2021-11-09 DIAGNOSIS — E782 Mixed hyperlipidemia: Secondary | ICD-10-CM | POA: Diagnosis not present

## 2021-11-09 DIAGNOSIS — I5042 Chronic combined systolic (congestive) and diastolic (congestive) heart failure: Secondary | ICD-10-CM | POA: Diagnosis not present

## 2021-11-09 DIAGNOSIS — Z6837 Body mass index (BMI) 37.0-37.9, adult: Secondary | ICD-10-CM | POA: Diagnosis not present

## 2021-11-09 DIAGNOSIS — Z7901 Long term (current) use of anticoagulants: Secondary | ICD-10-CM | POA: Diagnosis not present

## 2021-11-11 DIAGNOSIS — F17200 Nicotine dependence, unspecified, uncomplicated: Secondary | ICD-10-CM | POA: Diagnosis not present

## 2021-11-11 DIAGNOSIS — Z515 Encounter for palliative care: Secondary | ICD-10-CM | POA: Diagnosis not present

## 2021-11-11 DIAGNOSIS — J9811 Atelectasis: Secondary | ICD-10-CM | POA: Diagnosis not present

## 2021-11-11 DIAGNOSIS — R339 Retention of urine, unspecified: Secondary | ICD-10-CM | POA: Diagnosis not present

## 2021-11-11 DIAGNOSIS — I5021 Acute systolic (congestive) heart failure: Secondary | ICD-10-CM | POA: Diagnosis not present

## 2021-11-11 DIAGNOSIS — I214 Non-ST elevation (NSTEMI) myocardial infarction: Secondary | ICD-10-CM | POA: Diagnosis not present

## 2021-11-11 DIAGNOSIS — J9600 Acute respiratory failure, unspecified whether with hypoxia or hypercapnia: Secondary | ICD-10-CM | POA: Diagnosis not present

## 2021-11-11 DIAGNOSIS — Z952 Presence of prosthetic heart valve: Secondary | ICD-10-CM | POA: Diagnosis not present

## 2021-11-11 DIAGNOSIS — Z7901 Long term (current) use of anticoagulants: Secondary | ICD-10-CM | POA: Diagnosis not present

## 2021-11-11 DIAGNOSIS — I083 Combined rheumatic disorders of mitral, aortic and tricuspid valves: Secondary | ICD-10-CM | POA: Diagnosis not present

## 2021-11-11 DIAGNOSIS — R112 Nausea with vomiting, unspecified: Secondary | ICD-10-CM | POA: Diagnosis not present

## 2021-11-11 DIAGNOSIS — Z794 Long term (current) use of insulin: Secondary | ICD-10-CM | POA: Diagnosis not present

## 2021-11-11 DIAGNOSIS — E8729 Other acidosis: Secondary | ICD-10-CM | POA: Diagnosis not present

## 2021-11-11 DIAGNOSIS — I5020 Unspecified systolic (congestive) heart failure: Secondary | ICD-10-CM | POA: Diagnosis not present

## 2021-11-11 DIAGNOSIS — J441 Chronic obstructive pulmonary disease with (acute) exacerbation: Secondary | ICD-10-CM | POA: Diagnosis not present

## 2021-11-11 DIAGNOSIS — R42 Dizziness and giddiness: Secondary | ICD-10-CM | POA: Diagnosis not present

## 2021-11-11 DIAGNOSIS — J9622 Acute and chronic respiratory failure with hypercapnia: Secondary | ICD-10-CM | POA: Diagnosis not present

## 2021-11-11 DIAGNOSIS — I272 Pulmonary hypertension, unspecified: Secondary | ICD-10-CM | POA: Diagnosis not present

## 2021-11-11 DIAGNOSIS — Z720 Tobacco use: Secondary | ICD-10-CM | POA: Diagnosis not present

## 2021-11-11 DIAGNOSIS — I2510 Atherosclerotic heart disease of native coronary artery without angina pectoris: Secondary | ICD-10-CM | POA: Diagnosis not present

## 2021-11-11 DIAGNOSIS — R109 Unspecified abdominal pain: Secondary | ICD-10-CM | POA: Diagnosis not present

## 2021-11-11 DIAGNOSIS — I5040 Unspecified combined systolic (congestive) and diastolic (congestive) heart failure: Secondary | ICD-10-CM | POA: Diagnosis not present

## 2021-11-11 DIAGNOSIS — Z954 Presence of other heart-valve replacement: Secondary | ICD-10-CM | POA: Diagnosis not present

## 2021-11-11 DIAGNOSIS — E8770 Fluid overload, unspecified: Secondary | ICD-10-CM | POA: Diagnosis not present

## 2021-11-11 DIAGNOSIS — I5042 Chronic combined systolic (congestive) and diastolic (congestive) heart failure: Secondary | ICD-10-CM | POA: Diagnosis present

## 2021-11-11 DIAGNOSIS — E1122 Type 2 diabetes mellitus with diabetic chronic kidney disease: Secondary | ICD-10-CM | POA: Diagnosis not present

## 2021-11-11 DIAGNOSIS — M898X9 Other specified disorders of bone, unspecified site: Secondary | ICD-10-CM | POA: Diagnosis not present

## 2021-11-11 DIAGNOSIS — Z9981 Dependence on supplemental oxygen: Secondary | ICD-10-CM | POA: Diagnosis not present

## 2021-11-11 DIAGNOSIS — Z91199 Patient's noncompliance with other medical treatment and regimen due to unspecified reason: Secondary | ICD-10-CM | POA: Diagnosis not present

## 2021-11-11 DIAGNOSIS — J9621 Acute and chronic respiratory failure with hypoxia: Secondary | ICD-10-CM | POA: Diagnosis not present

## 2021-11-11 DIAGNOSIS — Z833 Family history of diabetes mellitus: Secondary | ICD-10-CM | POA: Diagnosis not present

## 2021-11-11 DIAGNOSIS — Z8679 Personal history of other diseases of the circulatory system: Secondary | ICD-10-CM | POA: Diagnosis not present

## 2021-11-11 DIAGNOSIS — K760 Fatty (change of) liver, not elsewhere classified: Secondary | ICD-10-CM | POA: Diagnosis not present

## 2021-11-11 DIAGNOSIS — R000 Tachycardia, unspecified: Secondary | ICD-10-CM | POA: Diagnosis not present

## 2021-11-11 DIAGNOSIS — R778 Other specified abnormalities of plasma proteins: Secondary | ICD-10-CM | POA: Diagnosis not present

## 2021-11-11 DIAGNOSIS — Z6837 Body mass index (BMI) 37.0-37.9, adult: Secondary | ICD-10-CM | POA: Diagnosis not present

## 2021-11-11 DIAGNOSIS — I21A1 Myocardial infarction type 2: Secondary | ICD-10-CM | POA: Diagnosis not present

## 2021-11-11 DIAGNOSIS — I471 Supraventricular tachycardia: Secondary | ICD-10-CM | POA: Diagnosis not present

## 2021-11-11 DIAGNOSIS — Z7951 Long term (current) use of inhaled steroids: Secondary | ICD-10-CM | POA: Diagnosis not present

## 2021-11-11 DIAGNOSIS — E1165 Type 2 diabetes mellitus with hyperglycemia: Secondary | ICD-10-CM | POA: Diagnosis not present

## 2021-11-11 DIAGNOSIS — I110 Hypertensive heart disease with heart failure: Secondary | ICD-10-CM | POA: Diagnosis not present

## 2021-11-11 DIAGNOSIS — G9340 Encephalopathy, unspecified: Secondary | ICD-10-CM | POA: Diagnosis not present

## 2021-11-11 DIAGNOSIS — E875 Hyperkalemia: Secondary | ICD-10-CM | POA: Diagnosis not present

## 2021-11-11 DIAGNOSIS — E7220 Disorder of urea cycle metabolism, unspecified: Secondary | ICD-10-CM | POA: Diagnosis not present

## 2021-11-11 DIAGNOSIS — Z86718 Personal history of other venous thrombosis and embolism: Secondary | ICD-10-CM | POA: Diagnosis not present

## 2021-11-11 DIAGNOSIS — G9341 Metabolic encephalopathy: Secondary | ICD-10-CM | POA: Diagnosis not present

## 2021-11-11 DIAGNOSIS — N184 Chronic kidney disease, stage 4 (severe): Secondary | ICD-10-CM | POA: Diagnosis not present

## 2021-11-11 DIAGNOSIS — I5022 Chronic systolic (congestive) heart failure: Secondary | ICD-10-CM | POA: Diagnosis not present

## 2021-11-11 DIAGNOSIS — J449 Chronic obstructive pulmonary disease, unspecified: Secondary | ICD-10-CM | POA: Diagnosis not present

## 2021-11-11 DIAGNOSIS — E785 Hyperlipidemia, unspecified: Secondary | ICD-10-CM | POA: Diagnosis not present

## 2021-11-11 DIAGNOSIS — E724 Disorders of ornithine metabolism: Secondary | ICD-10-CM | POA: Diagnosis not present

## 2021-11-11 DIAGNOSIS — F17210 Nicotine dependence, cigarettes, uncomplicated: Secondary | ICD-10-CM | POA: Diagnosis not present

## 2021-11-11 DIAGNOSIS — I517 Cardiomegaly: Secondary | ICD-10-CM | POA: Diagnosis not present

## 2021-11-11 DIAGNOSIS — Z79899 Other long term (current) drug therapy: Secondary | ICD-10-CM | POA: Diagnosis not present

## 2021-11-11 DIAGNOSIS — Z7984 Long term (current) use of oral hypoglycemic drugs: Secondary | ICD-10-CM | POA: Diagnosis not present

## 2021-11-11 DIAGNOSIS — I445 Left posterior fascicular block: Secondary | ICD-10-CM | POA: Diagnosis not present

## 2021-11-11 DIAGNOSIS — I130 Hypertensive heart and chronic kidney disease with heart failure and stage 1 through stage 4 chronic kidney disease, or unspecified chronic kidney disease: Secondary | ICD-10-CM | POA: Diagnosis not present

## 2021-11-11 DIAGNOSIS — G4733 Obstructive sleep apnea (adult) (pediatric): Secondary | ICD-10-CM | POA: Diagnosis present

## 2021-11-11 DIAGNOSIS — R0902 Hypoxemia: Secondary | ICD-10-CM | POA: Diagnosis not present

## 2021-11-11 DIAGNOSIS — J9601 Acute respiratory failure with hypoxia: Secondary | ICD-10-CM | POA: Diagnosis not present

## 2021-11-11 DIAGNOSIS — E669 Obesity, unspecified: Secondary | ICD-10-CM | POA: Diagnosis present

## 2021-11-11 DIAGNOSIS — R0602 Shortness of breath: Secondary | ICD-10-CM | POA: Diagnosis not present

## 2021-11-11 DIAGNOSIS — Z9989 Dependence on other enabling machines and devices: Secondary | ICD-10-CM | POA: Diagnosis not present

## 2021-11-11 DIAGNOSIS — E119 Type 2 diabetes mellitus without complications: Secondary | ICD-10-CM | POA: Diagnosis not present

## 2021-11-11 DIAGNOSIS — N179 Acute kidney failure, unspecified: Secondary | ICD-10-CM | POA: Diagnosis not present

## 2021-11-11 DIAGNOSIS — R0603 Acute respiratory distress: Secondary | ICD-10-CM | POA: Diagnosis not present

## 2021-11-12 DIAGNOSIS — I2510 Atherosclerotic heart disease of native coronary artery without angina pectoris: Secondary | ICD-10-CM | POA: Diagnosis not present

## 2021-11-12 DIAGNOSIS — E785 Hyperlipidemia, unspecified: Secondary | ICD-10-CM | POA: Diagnosis not present

## 2021-11-12 DIAGNOSIS — R0603 Acute respiratory distress: Secondary | ICD-10-CM | POA: Diagnosis not present

## 2021-11-12 DIAGNOSIS — I5020 Unspecified systolic (congestive) heart failure: Secondary | ICD-10-CM | POA: Diagnosis not present

## 2021-11-12 DIAGNOSIS — R778 Other specified abnormalities of plasma proteins: Secondary | ICD-10-CM | POA: Diagnosis not present

## 2021-11-12 DIAGNOSIS — E8770 Fluid overload, unspecified: Secondary | ICD-10-CM | POA: Diagnosis not present

## 2021-11-12 DIAGNOSIS — F17210 Nicotine dependence, cigarettes, uncomplicated: Secondary | ICD-10-CM | POA: Diagnosis not present

## 2021-11-12 DIAGNOSIS — Z79899 Other long term (current) drug therapy: Secondary | ICD-10-CM | POA: Diagnosis not present

## 2021-11-12 DIAGNOSIS — I5022 Chronic systolic (congestive) heart failure: Secondary | ICD-10-CM | POA: Diagnosis not present

## 2021-11-12 DIAGNOSIS — I5021 Acute systolic (congestive) heart failure: Secondary | ICD-10-CM | POA: Diagnosis not present

## 2021-11-12 DIAGNOSIS — E1122 Type 2 diabetes mellitus with diabetic chronic kidney disease: Secondary | ICD-10-CM | POA: Diagnosis not present

## 2021-11-12 DIAGNOSIS — Z91199 Patient's noncompliance with other medical treatment and regimen due to unspecified reason: Secondary | ICD-10-CM | POA: Diagnosis not present

## 2021-11-12 DIAGNOSIS — Z954 Presence of other heart-valve replacement: Secondary | ICD-10-CM | POA: Diagnosis not present

## 2021-11-12 DIAGNOSIS — I083 Combined rheumatic disorders of mitral, aortic and tricuspid valves: Secondary | ICD-10-CM | POA: Diagnosis not present

## 2021-11-12 DIAGNOSIS — M898X9 Other specified disorders of bone, unspecified site: Secondary | ICD-10-CM | POA: Diagnosis not present

## 2021-11-12 DIAGNOSIS — I21A1 Myocardial infarction type 2: Secondary | ICD-10-CM | POA: Diagnosis not present

## 2021-11-12 DIAGNOSIS — Z952 Presence of prosthetic heart valve: Secondary | ICD-10-CM | POA: Diagnosis not present

## 2021-11-12 DIAGNOSIS — N184 Chronic kidney disease, stage 4 (severe): Secondary | ICD-10-CM | POA: Diagnosis not present

## 2021-11-12 DIAGNOSIS — I471 Supraventricular tachycardia: Secondary | ICD-10-CM | POA: Diagnosis not present

## 2021-11-12 DIAGNOSIS — Z7901 Long term (current) use of anticoagulants: Secondary | ICD-10-CM | POA: Diagnosis not present

## 2021-11-12 DIAGNOSIS — J449 Chronic obstructive pulmonary disease, unspecified: Secondary | ICD-10-CM | POA: Diagnosis not present

## 2021-11-12 DIAGNOSIS — N179 Acute kidney failure, unspecified: Secondary | ICD-10-CM | POA: Diagnosis not present

## 2021-11-12 DIAGNOSIS — I130 Hypertensive heart and chronic kidney disease with heart failure and stage 1 through stage 4 chronic kidney disease, or unspecified chronic kidney disease: Secondary | ICD-10-CM | POA: Diagnosis not present

## 2021-11-12 DIAGNOSIS — E119 Type 2 diabetes mellitus without complications: Secondary | ICD-10-CM | POA: Diagnosis not present

## 2021-11-12 DIAGNOSIS — Z8679 Personal history of other diseases of the circulatory system: Secondary | ICD-10-CM | POA: Diagnosis not present

## 2021-11-13 DIAGNOSIS — I21A1 Myocardial infarction type 2: Secondary | ICD-10-CM | POA: Diagnosis not present

## 2021-11-13 DIAGNOSIS — E8770 Fluid overload, unspecified: Secondary | ICD-10-CM | POA: Diagnosis not present

## 2021-11-13 DIAGNOSIS — N184 Chronic kidney disease, stage 4 (severe): Secondary | ICD-10-CM | POA: Diagnosis not present

## 2021-11-13 DIAGNOSIS — I130 Hypertensive heart and chronic kidney disease with heart failure and stage 1 through stage 4 chronic kidney disease, or unspecified chronic kidney disease: Secondary | ICD-10-CM | POA: Diagnosis not present

## 2021-11-13 DIAGNOSIS — I2510 Atherosclerotic heart disease of native coronary artery without angina pectoris: Secondary | ICD-10-CM | POA: Diagnosis not present

## 2021-11-13 DIAGNOSIS — Z91199 Patient's noncompliance with other medical treatment and regimen due to unspecified reason: Secondary | ICD-10-CM | POA: Diagnosis not present

## 2021-11-13 DIAGNOSIS — I5022 Chronic systolic (congestive) heart failure: Secondary | ICD-10-CM | POA: Diagnosis not present

## 2021-11-13 DIAGNOSIS — I471 Supraventricular tachycardia: Secondary | ICD-10-CM | POA: Diagnosis not present

## 2021-11-13 DIAGNOSIS — Z7901 Long term (current) use of anticoagulants: Secondary | ICD-10-CM | POA: Diagnosis not present

## 2021-11-13 DIAGNOSIS — Z8679 Personal history of other diseases of the circulatory system: Secondary | ICD-10-CM | POA: Diagnosis not present

## 2021-11-13 DIAGNOSIS — E119 Type 2 diabetes mellitus without complications: Secondary | ICD-10-CM | POA: Diagnosis not present

## 2021-11-13 DIAGNOSIS — Z952 Presence of prosthetic heart valve: Secondary | ICD-10-CM | POA: Diagnosis not present

## 2021-11-13 DIAGNOSIS — Z79899 Other long term (current) drug therapy: Secondary | ICD-10-CM | POA: Diagnosis not present

## 2021-11-13 DIAGNOSIS — R0603 Acute respiratory distress: Secondary | ICD-10-CM | POA: Diagnosis not present

## 2021-11-13 DIAGNOSIS — N179 Acute kidney failure, unspecified: Secondary | ICD-10-CM | POA: Diagnosis not present

## 2021-11-13 DIAGNOSIS — I5020 Unspecified systolic (congestive) heart failure: Secondary | ICD-10-CM | POA: Diagnosis not present

## 2021-11-13 DIAGNOSIS — Z954 Presence of other heart-valve replacement: Secondary | ICD-10-CM | POA: Diagnosis not present

## 2021-11-13 DIAGNOSIS — I214 Non-ST elevation (NSTEMI) myocardial infarction: Secondary | ICD-10-CM | POA: Diagnosis not present

## 2021-11-13 DIAGNOSIS — F17200 Nicotine dependence, unspecified, uncomplicated: Secondary | ICD-10-CM | POA: Diagnosis not present

## 2021-11-13 DIAGNOSIS — M898X9 Other specified disorders of bone, unspecified site: Secondary | ICD-10-CM | POA: Diagnosis not present

## 2021-11-13 DIAGNOSIS — E785 Hyperlipidemia, unspecified: Secondary | ICD-10-CM | POA: Diagnosis not present

## 2021-11-13 DIAGNOSIS — I5021 Acute systolic (congestive) heart failure: Secondary | ICD-10-CM | POA: Diagnosis not present

## 2021-11-13 DIAGNOSIS — I083 Combined rheumatic disorders of mitral, aortic and tricuspid valves: Secondary | ICD-10-CM | POA: Diagnosis not present

## 2021-11-13 DIAGNOSIS — R778 Other specified abnormalities of plasma proteins: Secondary | ICD-10-CM | POA: Diagnosis not present

## 2021-11-13 DIAGNOSIS — J449 Chronic obstructive pulmonary disease, unspecified: Secondary | ICD-10-CM | POA: Diagnosis not present

## 2021-11-13 DIAGNOSIS — E1122 Type 2 diabetes mellitus with diabetic chronic kidney disease: Secondary | ICD-10-CM | POA: Diagnosis not present

## 2021-11-14 DIAGNOSIS — Z720 Tobacco use: Secondary | ICD-10-CM | POA: Diagnosis not present

## 2021-11-14 DIAGNOSIS — N184 Chronic kidney disease, stage 4 (severe): Secondary | ICD-10-CM | POA: Diagnosis not present

## 2021-11-14 DIAGNOSIS — Z9981 Dependence on supplemental oxygen: Secondary | ICD-10-CM | POA: Diagnosis not present

## 2021-11-14 DIAGNOSIS — N179 Acute kidney failure, unspecified: Secondary | ICD-10-CM | POA: Diagnosis not present

## 2021-11-14 DIAGNOSIS — E875 Hyperkalemia: Secondary | ICD-10-CM | POA: Diagnosis not present

## 2021-11-14 DIAGNOSIS — I445 Left posterior fascicular block: Secondary | ICD-10-CM | POA: Diagnosis not present

## 2021-11-14 DIAGNOSIS — I214 Non-ST elevation (NSTEMI) myocardial infarction: Secondary | ICD-10-CM | POA: Diagnosis not present

## 2021-11-14 DIAGNOSIS — J449 Chronic obstructive pulmonary disease, unspecified: Secondary | ICD-10-CM | POA: Diagnosis not present

## 2021-11-14 DIAGNOSIS — I471 Supraventricular tachycardia: Secondary | ICD-10-CM | POA: Diagnosis not present

## 2021-11-15 DIAGNOSIS — N179 Acute kidney failure, unspecified: Secondary | ICD-10-CM | POA: Diagnosis not present

## 2021-11-15 DIAGNOSIS — Z720 Tobacco use: Secondary | ICD-10-CM | POA: Diagnosis not present

## 2021-11-15 DIAGNOSIS — Z9981 Dependence on supplemental oxygen: Secondary | ICD-10-CM | POA: Diagnosis not present

## 2021-11-15 DIAGNOSIS — I214 Non-ST elevation (NSTEMI) myocardial infarction: Secondary | ICD-10-CM | POA: Diagnosis not present

## 2021-11-15 DIAGNOSIS — E875 Hyperkalemia: Secondary | ICD-10-CM | POA: Diagnosis not present

## 2021-11-15 DIAGNOSIS — J449 Chronic obstructive pulmonary disease, unspecified: Secondary | ICD-10-CM | POA: Diagnosis not present

## 2021-11-15 DIAGNOSIS — N184 Chronic kidney disease, stage 4 (severe): Secondary | ICD-10-CM | POA: Diagnosis not present

## 2021-11-15 DIAGNOSIS — I471 Supraventricular tachycardia: Secondary | ICD-10-CM | POA: Diagnosis not present

## 2021-11-16 DIAGNOSIS — Z720 Tobacco use: Secondary | ICD-10-CM | POA: Diagnosis not present

## 2021-11-16 DIAGNOSIS — I214 Non-ST elevation (NSTEMI) myocardial infarction: Secondary | ICD-10-CM | POA: Diagnosis not present

## 2021-11-16 DIAGNOSIS — I471 Supraventricular tachycardia: Secondary | ICD-10-CM | POA: Diagnosis not present

## 2021-11-16 DIAGNOSIS — Z9981 Dependence on supplemental oxygen: Secondary | ICD-10-CM | POA: Diagnosis not present

## 2021-11-16 DIAGNOSIS — J449 Chronic obstructive pulmonary disease, unspecified: Secondary | ICD-10-CM | POA: Diagnosis not present

## 2021-11-16 DIAGNOSIS — N184 Chronic kidney disease, stage 4 (severe): Secondary | ICD-10-CM | POA: Diagnosis not present

## 2021-11-16 DIAGNOSIS — N179 Acute kidney failure, unspecified: Secondary | ICD-10-CM | POA: Diagnosis not present

## 2021-11-17 DIAGNOSIS — Z720 Tobacco use: Secondary | ICD-10-CM | POA: Diagnosis not present

## 2021-11-17 DIAGNOSIS — I214 Non-ST elevation (NSTEMI) myocardial infarction: Secondary | ICD-10-CM | POA: Diagnosis not present

## 2021-11-17 DIAGNOSIS — Z9981 Dependence on supplemental oxygen: Secondary | ICD-10-CM | POA: Diagnosis not present

## 2021-11-17 DIAGNOSIS — N184 Chronic kidney disease, stage 4 (severe): Secondary | ICD-10-CM | POA: Diagnosis not present

## 2021-11-17 DIAGNOSIS — J449 Chronic obstructive pulmonary disease, unspecified: Secondary | ICD-10-CM | POA: Diagnosis not present

## 2021-11-17 DIAGNOSIS — N179 Acute kidney failure, unspecified: Secondary | ICD-10-CM | POA: Diagnosis not present

## 2021-11-17 DIAGNOSIS — I471 Supraventricular tachycardia: Secondary | ICD-10-CM | POA: Diagnosis not present

## 2021-11-28 DIAGNOSIS — Z79899 Other long term (current) drug therapy: Secondary | ICD-10-CM | POA: Diagnosis not present

## 2021-11-28 DIAGNOSIS — Z6837 Body mass index (BMI) 37.0-37.9, adult: Secondary | ICD-10-CM | POA: Diagnosis not present

## 2021-11-28 DIAGNOSIS — J449 Chronic obstructive pulmonary disease, unspecified: Secondary | ICD-10-CM | POA: Diagnosis not present

## 2021-11-28 DIAGNOSIS — Z7901 Long term (current) use of anticoagulants: Secondary | ICD-10-CM | POA: Diagnosis not present

## 2021-11-28 DIAGNOSIS — N184 Chronic kidney disease, stage 4 (severe): Secondary | ICD-10-CM | POA: Diagnosis not present

## 2021-11-28 DIAGNOSIS — G4730 Sleep apnea, unspecified: Secondary | ICD-10-CM | POA: Diagnosis not present

## 2021-12-02 DIAGNOSIS — J449 Chronic obstructive pulmonary disease, unspecified: Secondary | ICD-10-CM | POA: Diagnosis not present

## 2021-12-02 DIAGNOSIS — N184 Chronic kidney disease, stage 4 (severe): Secondary | ICD-10-CM | POA: Diagnosis not present

## 2021-12-15 DIAGNOSIS — I471 Supraventricular tachycardia: Secondary | ICD-10-CM | POA: Diagnosis not present

## 2021-12-15 DIAGNOSIS — Z7901 Long term (current) use of anticoagulants: Secondary | ICD-10-CM | POA: Diagnosis not present

## 2021-12-15 DIAGNOSIS — Z952 Presence of prosthetic heart valve: Secondary | ICD-10-CM | POA: Diagnosis not present

## 2021-12-15 DIAGNOSIS — I493 Ventricular premature depolarization: Secondary | ICD-10-CM | POA: Diagnosis not present

## 2021-12-15 DIAGNOSIS — E785 Hyperlipidemia, unspecified: Secondary | ICD-10-CM | POA: Diagnosis not present

## 2021-12-15 DIAGNOSIS — Z91199 Patient's noncompliance with other medical treatment and regimen due to unspecified reason: Secondary | ICD-10-CM | POA: Diagnosis not present

## 2021-12-15 DIAGNOSIS — J449 Chronic obstructive pulmonary disease, unspecified: Secondary | ICD-10-CM | POA: Diagnosis not present

## 2021-12-15 DIAGNOSIS — N184 Chronic kidney disease, stage 4 (severe): Secondary | ICD-10-CM | POA: Diagnosis not present

## 2021-12-15 DIAGNOSIS — I130 Hypertensive heart and chronic kidney disease with heart failure and stage 1 through stage 4 chronic kidney disease, or unspecified chronic kidney disease: Secondary | ICD-10-CM | POA: Diagnosis not present

## 2021-12-15 DIAGNOSIS — I5022 Chronic systolic (congestive) heart failure: Secondary | ICD-10-CM | POA: Diagnosis not present

## 2021-12-15 DIAGNOSIS — Z9981 Dependence on supplemental oxygen: Secondary | ICD-10-CM | POA: Diagnosis not present

## 2021-12-15 DIAGNOSIS — G4733 Obstructive sleep apnea (adult) (pediatric): Secondary | ICD-10-CM | POA: Diagnosis not present

## 2021-12-16 DIAGNOSIS — I5022 Chronic systolic (congestive) heart failure: Secondary | ICD-10-CM | POA: Diagnosis not present

## 2021-12-16 DIAGNOSIS — I499 Cardiac arrhythmia, unspecified: Secondary | ICD-10-CM | POA: Diagnosis not present

## 2021-12-16 DIAGNOSIS — I493 Ventricular premature depolarization: Secondary | ICD-10-CM | POA: Diagnosis not present

## 2021-12-22 DIAGNOSIS — N184 Chronic kidney disease, stage 4 (severe): Secondary | ICD-10-CM | POA: Diagnosis not present

## 2021-12-23 DIAGNOSIS — N184 Chronic kidney disease, stage 4 (severe): Secondary | ICD-10-CM | POA: Diagnosis not present

## 2021-12-23 DIAGNOSIS — J9612 Chronic respiratory failure with hypercapnia: Secondary | ICD-10-CM | POA: Diagnosis not present

## 2021-12-23 DIAGNOSIS — Z720 Tobacco use: Secondary | ICD-10-CM | POA: Diagnosis not present

## 2021-12-23 DIAGNOSIS — J9611 Chronic respiratory failure with hypoxia: Secondary | ICD-10-CM | POA: Diagnosis not present

## 2021-12-23 DIAGNOSIS — R7989 Other specified abnormal findings of blood chemistry: Secondary | ICD-10-CM | POA: Diagnosis not present

## 2022-01-06 DIAGNOSIS — E782 Mixed hyperlipidemia: Secondary | ICD-10-CM | POA: Diagnosis not present

## 2022-01-06 DIAGNOSIS — N184 Chronic kidney disease, stage 4 (severe): Secondary | ICD-10-CM | POA: Diagnosis not present

## 2022-01-06 DIAGNOSIS — Z6837 Body mass index (BMI) 37.0-37.9, adult: Secondary | ICD-10-CM | POA: Diagnosis not present

## 2022-01-06 DIAGNOSIS — Z7901 Long term (current) use of anticoagulants: Secondary | ICD-10-CM | POA: Diagnosis not present

## 2022-01-06 DIAGNOSIS — I5042 Chronic combined systolic (congestive) and diastolic (congestive) heart failure: Secondary | ICD-10-CM | POA: Diagnosis not present

## 2022-01-13 DIAGNOSIS — F17210 Nicotine dependence, cigarettes, uncomplicated: Secondary | ICD-10-CM | POA: Diagnosis not present

## 2022-01-13 DIAGNOSIS — J209 Acute bronchitis, unspecified: Secondary | ICD-10-CM | POA: Diagnosis not present

## 2022-01-13 DIAGNOSIS — J449 Chronic obstructive pulmonary disease, unspecified: Secondary | ICD-10-CM | POA: Diagnosis not present

## 2022-01-28 DIAGNOSIS — Z952 Presence of prosthetic heart valve: Secondary | ICD-10-CM | POA: Diagnosis not present

## 2022-01-28 DIAGNOSIS — G4733 Obstructive sleep apnea (adult) (pediatric): Secondary | ICD-10-CM | POA: Diagnosis not present

## 2022-01-28 DIAGNOSIS — F17210 Nicotine dependence, cigarettes, uncomplicated: Secondary | ICD-10-CM | POA: Diagnosis not present

## 2022-01-28 DIAGNOSIS — I493 Ventricular premature depolarization: Secondary | ICD-10-CM | POA: Diagnosis not present

## 2022-01-28 DIAGNOSIS — I130 Hypertensive heart and chronic kidney disease with heart failure and stage 1 through stage 4 chronic kidney disease, or unspecified chronic kidney disease: Secondary | ICD-10-CM | POA: Diagnosis not present

## 2022-01-28 DIAGNOSIS — J9612 Chronic respiratory failure with hypercapnia: Secondary | ICD-10-CM | POA: Diagnosis not present

## 2022-01-28 DIAGNOSIS — J9611 Chronic respiratory failure with hypoxia: Secondary | ICD-10-CM | POA: Diagnosis not present

## 2022-01-28 DIAGNOSIS — J9602 Acute respiratory failure with hypercapnia: Secondary | ICD-10-CM | POA: Diagnosis not present

## 2022-01-28 DIAGNOSIS — N189 Chronic kidney disease, unspecified: Secondary | ICD-10-CM | POA: Diagnosis not present

## 2022-01-28 DIAGNOSIS — J9621 Acute and chronic respiratory failure with hypoxia: Secondary | ICD-10-CM | POA: Diagnosis present

## 2022-01-28 DIAGNOSIS — Z781 Physical restraint status: Secondary | ICD-10-CM | POA: Diagnosis not present

## 2022-01-28 DIAGNOSIS — I7121 Aneurysm of the ascending aorta, without rupture: Secondary | ICD-10-CM | POA: Diagnosis present

## 2022-01-28 DIAGNOSIS — I5022 Chronic systolic (congestive) heart failure: Secondary | ICD-10-CM | POA: Diagnosis not present

## 2022-01-28 DIAGNOSIS — J9622 Acute and chronic respiratory failure with hypercapnia: Secondary | ICD-10-CM | POA: Diagnosis not present

## 2022-01-28 DIAGNOSIS — Z6830 Body mass index (BMI) 30.0-30.9, adult: Secondary | ICD-10-CM | POA: Diagnosis not present

## 2022-01-28 DIAGNOSIS — J811 Chronic pulmonary edema: Secondary | ICD-10-CM | POA: Diagnosis not present

## 2022-01-28 DIAGNOSIS — I5043 Acute on chronic combined systolic (congestive) and diastolic (congestive) heart failure: Secondary | ICD-10-CM | POA: Diagnosis not present

## 2022-01-28 DIAGNOSIS — J90 Pleural effusion, not elsewhere classified: Secondary | ICD-10-CM | POA: Diagnosis not present

## 2022-01-28 DIAGNOSIS — G8929 Other chronic pain: Secondary | ICD-10-CM | POA: Diagnosis not present

## 2022-01-28 DIAGNOSIS — J9692 Respiratory failure, unspecified with hypercapnia: Secondary | ICD-10-CM | POA: Diagnosis not present

## 2022-01-28 DIAGNOSIS — D649 Anemia, unspecified: Secondary | ICD-10-CM | POA: Diagnosis not present

## 2022-01-28 DIAGNOSIS — I5023 Acute on chronic systolic (congestive) heart failure: Secondary | ICD-10-CM | POA: Diagnosis not present

## 2022-01-28 DIAGNOSIS — I878 Other specified disorders of veins: Secondary | ICD-10-CM | POA: Diagnosis not present

## 2022-01-28 DIAGNOSIS — J441 Chronic obstructive pulmonary disease with (acute) exacerbation: Secondary | ICD-10-CM | POA: Diagnosis not present

## 2022-01-28 DIAGNOSIS — D539 Nutritional anemia, unspecified: Secondary | ICD-10-CM | POA: Diagnosis not present

## 2022-01-28 DIAGNOSIS — J449 Chronic obstructive pulmonary disease, unspecified: Secondary | ICD-10-CM | POA: Diagnosis not present

## 2022-01-28 DIAGNOSIS — E785 Hyperlipidemia, unspecified: Secondary | ICD-10-CM | POA: Diagnosis present

## 2022-01-28 DIAGNOSIS — Z954 Presence of other heart-valve replacement: Secondary | ICD-10-CM | POA: Diagnosis not present

## 2022-01-28 DIAGNOSIS — R079 Chest pain, unspecified: Secondary | ICD-10-CM | POA: Diagnosis not present

## 2022-01-28 DIAGNOSIS — E669 Obesity, unspecified: Secondary | ICD-10-CM | POA: Diagnosis not present

## 2022-01-28 DIAGNOSIS — Z96643 Presence of artificial hip joint, bilateral: Secondary | ICD-10-CM | POA: Diagnosis not present

## 2022-01-28 DIAGNOSIS — J9690 Respiratory failure, unspecified, unspecified whether with hypoxia or hypercapnia: Secondary | ICD-10-CM | POA: Diagnosis not present

## 2022-01-28 DIAGNOSIS — Z20822 Contact with and (suspected) exposure to covid-19: Secondary | ICD-10-CM | POA: Diagnosis not present

## 2022-01-28 DIAGNOSIS — I080 Rheumatic disorders of both mitral and aortic valves: Secondary | ICD-10-CM | POA: Diagnosis not present

## 2022-01-28 DIAGNOSIS — I5020 Unspecified systolic (congestive) heart failure: Secondary | ICD-10-CM | POA: Diagnosis not present

## 2022-01-28 DIAGNOSIS — N184 Chronic kidney disease, stage 4 (severe): Secondary | ICD-10-CM | POA: Diagnosis not present

## 2022-01-28 DIAGNOSIS — I509 Heart failure, unspecified: Secondary | ICD-10-CM | POA: Diagnosis not present

## 2022-01-28 DIAGNOSIS — G894 Chronic pain syndrome: Secondary | ICD-10-CM | POA: Diagnosis not present

## 2022-01-28 DIAGNOSIS — Z7901 Long term (current) use of anticoagulants: Secondary | ICD-10-CM | POA: Diagnosis not present

## 2022-01-28 DIAGNOSIS — I2510 Atherosclerotic heart disease of native coronary artery without angina pectoris: Secondary | ICD-10-CM | POA: Diagnosis not present

## 2022-01-28 DIAGNOSIS — Z9981 Dependence on supplemental oxygen: Secondary | ICD-10-CM | POA: Diagnosis not present

## 2022-01-28 DIAGNOSIS — I429 Cardiomyopathy, unspecified: Secondary | ICD-10-CM | POA: Diagnosis not present

## 2022-01-28 DIAGNOSIS — R0602 Shortness of breath: Secondary | ICD-10-CM | POA: Diagnosis not present

## 2022-01-28 DIAGNOSIS — J9601 Acute respiratory failure with hypoxia: Secondary | ICD-10-CM | POA: Diagnosis not present

## 2022-01-28 DIAGNOSIS — Z91199 Patient's noncompliance with other medical treatment and regimen due to unspecified reason: Secondary | ICD-10-CM | POA: Diagnosis not present

## 2022-01-28 DIAGNOSIS — E781 Pure hyperglyceridemia: Secondary | ICD-10-CM | POA: Diagnosis not present

## 2022-02-06 DIAGNOSIS — J441 Chronic obstructive pulmonary disease with (acute) exacerbation: Secondary | ICD-10-CM | POA: Diagnosis not present

## 2022-02-06 DIAGNOSIS — J90 Pleural effusion, not elsewhere classified: Secondary | ICD-10-CM | POA: Diagnosis not present

## 2022-02-06 DIAGNOSIS — N179 Acute kidney failure, unspecified: Secondary | ICD-10-CM | POA: Diagnosis not present

## 2022-02-06 DIAGNOSIS — Z4682 Encounter for fitting and adjustment of non-vascular catheter: Secondary | ICD-10-CM | POA: Diagnosis not present

## 2022-02-06 DIAGNOSIS — R0602 Shortness of breath: Secondary | ICD-10-CM | POA: Diagnosis not present

## 2022-02-06 DIAGNOSIS — J9601 Acute respiratory failure with hypoxia: Secondary | ICD-10-CM | POA: Diagnosis not present

## 2022-02-06 DIAGNOSIS — R918 Other nonspecific abnormal finding of lung field: Secondary | ICD-10-CM | POA: Diagnosis not present

## 2022-02-06 DIAGNOSIS — J9602 Acute respiratory failure with hypercapnia: Secondary | ICD-10-CM | POA: Diagnosis not present

## 2022-02-07 DIAGNOSIS — J449 Chronic obstructive pulmonary disease, unspecified: Secondary | ICD-10-CM | POA: Diagnosis not present

## 2022-02-07 DIAGNOSIS — J9602 Acute respiratory failure with hypercapnia: Secondary | ICD-10-CM | POA: Diagnosis not present

## 2022-02-07 DIAGNOSIS — D696 Thrombocytopenia, unspecified: Secondary | ICD-10-CM | POA: Diagnosis not present

## 2022-02-07 DIAGNOSIS — N179 Acute kidney failure, unspecified: Secondary | ICD-10-CM | POA: Diagnosis not present

## 2022-02-07 DIAGNOSIS — J9601 Acute respiratory failure with hypoxia: Secondary | ICD-10-CM | POA: Diagnosis not present

## 2022-02-07 DIAGNOSIS — Z7901 Long term (current) use of anticoagulants: Secondary | ICD-10-CM | POA: Diagnosis not present

## 2022-02-07 DIAGNOSIS — D631 Anemia in chronic kidney disease: Secondary | ICD-10-CM | POA: Diagnosis not present

## 2022-02-07 DIAGNOSIS — J90 Pleural effusion, not elsewhere classified: Secondary | ICD-10-CM | POA: Diagnosis not present

## 2022-02-07 DIAGNOSIS — I5023 Acute on chronic systolic (congestive) heart failure: Secondary | ICD-10-CM | POA: Diagnosis not present

## 2022-02-07 DIAGNOSIS — E785 Hyperlipidemia, unspecified: Secondary | ICD-10-CM | POA: Diagnosis not present

## 2022-02-07 DIAGNOSIS — R918 Other nonspecific abnormal finding of lung field: Secondary | ICD-10-CM | POA: Diagnosis not present

## 2022-02-07 DIAGNOSIS — N184 Chronic kidney disease, stage 4 (severe): Secondary | ICD-10-CM | POA: Diagnosis not present

## 2022-02-07 DIAGNOSIS — R579 Shock, unspecified: Secondary | ICD-10-CM | POA: Diagnosis not present

## 2022-02-07 DIAGNOSIS — I493 Ventricular premature depolarization: Secondary | ICD-10-CM | POA: Diagnosis not present

## 2022-02-07 DIAGNOSIS — Z4682 Encounter for fitting and adjustment of non-vascular catheter: Secondary | ICD-10-CM | POA: Diagnosis not present

## 2022-02-08 DIAGNOSIS — I5022 Chronic systolic (congestive) heart failure: Secondary | ICD-10-CM | POA: Diagnosis not present

## 2022-02-08 DIAGNOSIS — Z952 Presence of prosthetic heart valve: Secondary | ICD-10-CM | POA: Diagnosis not present

## 2022-02-08 DIAGNOSIS — I420 Dilated cardiomyopathy: Secondary | ICD-10-CM | POA: Diagnosis not present

## 2022-02-08 DIAGNOSIS — Z9911 Dependence on respirator [ventilator] status: Secondary | ICD-10-CM | POA: Diagnosis not present

## 2022-02-08 DIAGNOSIS — N179 Acute kidney failure, unspecified: Secondary | ICD-10-CM | POA: Diagnosis not present

## 2022-02-08 DIAGNOSIS — I359 Nonrheumatic aortic valve disorder, unspecified: Secondary | ICD-10-CM | POA: Diagnosis not present

## 2022-02-08 DIAGNOSIS — R739 Hyperglycemia, unspecified: Secondary | ICD-10-CM | POA: Diagnosis not present

## 2022-02-08 DIAGNOSIS — Z91198 Patient's noncompliance with other medical treatment and regimen for other reason: Secondary | ICD-10-CM | POA: Diagnosis not present

## 2022-02-08 DIAGNOSIS — N184 Chronic kidney disease, stage 4 (severe): Secondary | ICD-10-CM | POA: Diagnosis not present

## 2022-02-08 DIAGNOSIS — R579 Shock, unspecified: Secondary | ICD-10-CM | POA: Diagnosis not present

## 2022-02-08 DIAGNOSIS — D696 Thrombocytopenia, unspecified: Secondary | ICD-10-CM | POA: Diagnosis not present

## 2022-02-08 DIAGNOSIS — J9600 Acute respiratory failure, unspecified whether with hypoxia or hypercapnia: Secondary | ICD-10-CM | POA: Diagnosis not present

## 2022-02-08 DIAGNOSIS — J9622 Acute and chronic respiratory failure with hypercapnia: Secondary | ICD-10-CM | POA: Diagnosis not present

## 2022-02-08 DIAGNOSIS — Z8674 Personal history of sudden cardiac arrest: Secondary | ICD-10-CM | POA: Diagnosis not present

## 2022-02-08 DIAGNOSIS — Z7901 Long term (current) use of anticoagulants: Secondary | ICD-10-CM | POA: Diagnosis not present

## 2022-02-08 DIAGNOSIS — D631 Anemia in chronic kidney disease: Secondary | ICD-10-CM | POA: Diagnosis not present

## 2022-02-08 DIAGNOSIS — J9621 Acute and chronic respiratory failure with hypoxia: Secondary | ICD-10-CM | POA: Diagnosis not present

## 2022-02-08 DIAGNOSIS — I130 Hypertensive heart and chronic kidney disease with heart failure and stage 1 through stage 4 chronic kidney disease, or unspecified chronic kidney disease: Secondary | ICD-10-CM | POA: Diagnosis not present

## 2022-02-08 DIAGNOSIS — R001 Bradycardia, unspecified: Secondary | ICD-10-CM | POA: Diagnosis not present

## 2022-02-08 DIAGNOSIS — J398 Other specified diseases of upper respiratory tract: Secondary | ICD-10-CM | POA: Diagnosis not present

## 2022-02-08 DIAGNOSIS — E785 Hyperlipidemia, unspecified: Secondary | ICD-10-CM | POA: Diagnosis not present

## 2022-02-08 DIAGNOSIS — J449 Chronic obstructive pulmonary disease, unspecified: Secondary | ICD-10-CM | POA: Diagnosis not present

## 2022-02-08 DIAGNOSIS — I5020 Unspecified systolic (congestive) heart failure: Secondary | ICD-10-CM | POA: Diagnosis not present

## 2022-02-08 DIAGNOSIS — I7121 Aneurysm of the ascending aorta, without rupture: Secondary | ICD-10-CM | POA: Diagnosis not present

## 2022-02-09 DIAGNOSIS — J9621 Acute and chronic respiratory failure with hypoxia: Secondary | ICD-10-CM | POA: Diagnosis not present

## 2022-02-09 DIAGNOSIS — J9622 Acute and chronic respiratory failure with hypercapnia: Secondary | ICD-10-CM | POA: Diagnosis not present

## 2022-02-09 DIAGNOSIS — E119 Type 2 diabetes mellitus without complications: Secondary | ICD-10-CM | POA: Diagnosis not present

## 2022-02-09 DIAGNOSIS — R579 Shock, unspecified: Secondary | ICD-10-CM | POA: Diagnosis not present

## 2022-02-09 DIAGNOSIS — D696 Thrombocytopenia, unspecified: Secondary | ICD-10-CM | POA: Diagnosis not present

## 2022-02-09 DIAGNOSIS — R739 Hyperglycemia, unspecified: Secondary | ICD-10-CM | POA: Diagnosis not present

## 2022-02-09 DIAGNOSIS — N189 Chronic kidney disease, unspecified: Secondary | ICD-10-CM | POA: Diagnosis not present

## 2022-02-09 DIAGNOSIS — Z91199 Patient's noncompliance with other medical treatment and regimen due to unspecified reason: Secondary | ICD-10-CM | POA: Diagnosis not present

## 2022-02-09 DIAGNOSIS — J449 Chronic obstructive pulmonary disease, unspecified: Secondary | ICD-10-CM | POA: Diagnosis not present

## 2022-02-09 DIAGNOSIS — Z951 Presence of aortocoronary bypass graft: Secondary | ICD-10-CM | POA: Diagnosis not present

## 2022-02-09 DIAGNOSIS — E785 Hyperlipidemia, unspecified: Secondary | ICD-10-CM | POA: Diagnosis not present

## 2022-02-09 DIAGNOSIS — N179 Acute kidney failure, unspecified: Secondary | ICD-10-CM | POA: Diagnosis not present

## 2022-02-09 DIAGNOSIS — D631 Anemia in chronic kidney disease: Secondary | ICD-10-CM | POA: Diagnosis not present

## 2022-02-09 DIAGNOSIS — I5022 Chronic systolic (congestive) heart failure: Secondary | ICD-10-CM | POA: Diagnosis not present

## 2022-02-09 DIAGNOSIS — R918 Other nonspecific abnormal finding of lung field: Secondary | ICD-10-CM | POA: Diagnosis not present

## 2022-02-09 DIAGNOSIS — I130 Hypertensive heart and chronic kidney disease with heart failure and stage 1 through stage 4 chronic kidney disease, or unspecified chronic kidney disease: Secondary | ICD-10-CM | POA: Diagnosis not present

## 2022-02-09 DIAGNOSIS — N184 Chronic kidney disease, stage 4 (severe): Secondary | ICD-10-CM | POA: Diagnosis not present

## 2022-02-09 DIAGNOSIS — I428 Other cardiomyopathies: Secondary | ICD-10-CM | POA: Diagnosis not present

## 2022-02-09 DIAGNOSIS — I5020 Unspecified systolic (congestive) heart failure: Secondary | ICD-10-CM | POA: Diagnosis not present

## 2022-02-10 DIAGNOSIS — Z7901 Long term (current) use of anticoagulants: Secondary | ICD-10-CM | POA: Diagnosis not present

## 2022-02-10 DIAGNOSIS — R0602 Shortness of breath: Secondary | ICD-10-CM | POA: Diagnosis not present

## 2022-02-10 DIAGNOSIS — I5020 Unspecified systolic (congestive) heart failure: Secondary | ICD-10-CM | POA: Diagnosis not present

## 2022-02-10 DIAGNOSIS — I428 Other cardiomyopathies: Secondary | ICD-10-CM | POA: Diagnosis not present

## 2022-02-10 DIAGNOSIS — R791 Abnormal coagulation profile: Secondary | ICD-10-CM | POA: Diagnosis not present

## 2022-02-10 DIAGNOSIS — J449 Chronic obstructive pulmonary disease, unspecified: Secondary | ICD-10-CM | POA: Diagnosis not present

## 2022-02-10 DIAGNOSIS — J9811 Atelectasis: Secondary | ICD-10-CM | POA: Diagnosis not present

## 2022-02-10 DIAGNOSIS — J9622 Acute and chronic respiratory failure with hypercapnia: Secondary | ICD-10-CM | POA: Diagnosis not present

## 2022-02-10 DIAGNOSIS — J90 Pleural effusion, not elsewhere classified: Secondary | ICD-10-CM | POA: Diagnosis not present

## 2022-02-10 DIAGNOSIS — J9621 Acute and chronic respiratory failure with hypoxia: Secondary | ICD-10-CM | POA: Diagnosis not present

## 2022-02-10 DIAGNOSIS — N189 Chronic kidney disease, unspecified: Secondary | ICD-10-CM | POA: Diagnosis not present

## 2022-02-10 DIAGNOSIS — D696 Thrombocytopenia, unspecified: Secondary | ICD-10-CM | POA: Diagnosis not present

## 2022-02-10 DIAGNOSIS — E785 Hyperlipidemia, unspecified: Secondary | ICD-10-CM | POA: Diagnosis not present

## 2022-02-10 DIAGNOSIS — D649 Anemia, unspecified: Secondary | ICD-10-CM | POA: Diagnosis not present

## 2022-02-10 DIAGNOSIS — Z952 Presence of prosthetic heart valve: Secondary | ICD-10-CM | POA: Diagnosis not present

## 2022-02-10 DIAGNOSIS — E119 Type 2 diabetes mellitus without complications: Secondary | ICD-10-CM | POA: Diagnosis not present

## 2022-02-10 DIAGNOSIS — N184 Chronic kidney disease, stage 4 (severe): Secondary | ICD-10-CM | POA: Diagnosis not present

## 2022-02-11 DIAGNOSIS — D649 Anemia, unspecified: Secondary | ICD-10-CM | POA: Diagnosis not present

## 2022-02-11 DIAGNOSIS — E119 Type 2 diabetes mellitus without complications: Secondary | ICD-10-CM | POA: Diagnosis not present

## 2022-02-11 DIAGNOSIS — I5020 Unspecified systolic (congestive) heart failure: Secondary | ICD-10-CM | POA: Diagnosis not present

## 2022-02-11 DIAGNOSIS — J449 Chronic obstructive pulmonary disease, unspecified: Secondary | ICD-10-CM | POA: Diagnosis not present

## 2022-02-11 DIAGNOSIS — E785 Hyperlipidemia, unspecified: Secondary | ICD-10-CM | POA: Diagnosis not present

## 2022-02-11 DIAGNOSIS — I428 Other cardiomyopathies: Secondary | ICD-10-CM | POA: Diagnosis not present

## 2022-02-11 DIAGNOSIS — J9622 Acute and chronic respiratory failure with hypercapnia: Secondary | ICD-10-CM | POA: Diagnosis not present

## 2022-02-11 DIAGNOSIS — Z952 Presence of prosthetic heart valve: Secondary | ICD-10-CM | POA: Diagnosis not present

## 2022-02-11 DIAGNOSIS — J9621 Acute and chronic respiratory failure with hypoxia: Secondary | ICD-10-CM | POA: Diagnosis not present

## 2022-02-11 DIAGNOSIS — R791 Abnormal coagulation profile: Secondary | ICD-10-CM | POA: Diagnosis not present

## 2022-02-11 DIAGNOSIS — D696 Thrombocytopenia, unspecified: Secondary | ICD-10-CM | POA: Diagnosis not present

## 2022-02-11 DIAGNOSIS — N184 Chronic kidney disease, stage 4 (severe): Secondary | ICD-10-CM | POA: Diagnosis not present

## 2022-02-11 DIAGNOSIS — N189 Chronic kidney disease, unspecified: Secondary | ICD-10-CM | POA: Diagnosis not present

## 2022-02-11 DIAGNOSIS — Z7901 Long term (current) use of anticoagulants: Secondary | ICD-10-CM | POA: Diagnosis not present

## 2022-02-12 DIAGNOSIS — I5020 Unspecified systolic (congestive) heart failure: Secondary | ICD-10-CM | POA: Diagnosis not present

## 2022-02-12 DIAGNOSIS — I428 Other cardiomyopathies: Secondary | ICD-10-CM | POA: Diagnosis not present

## 2022-02-12 DIAGNOSIS — Z7901 Long term (current) use of anticoagulants: Secondary | ICD-10-CM | POA: Diagnosis not present

## 2022-02-12 DIAGNOSIS — Z952 Presence of prosthetic heart valve: Secondary | ICD-10-CM | POA: Diagnosis not present

## 2022-02-12 DIAGNOSIS — N184 Chronic kidney disease, stage 4 (severe): Secondary | ICD-10-CM | POA: Diagnosis not present

## 2022-02-12 DIAGNOSIS — J9621 Acute and chronic respiratory failure with hypoxia: Secondary | ICD-10-CM | POA: Diagnosis not present

## 2022-02-12 DIAGNOSIS — E119 Type 2 diabetes mellitus without complications: Secondary | ICD-10-CM | POA: Diagnosis not present

## 2022-02-12 DIAGNOSIS — B009 Herpesviral infection, unspecified: Secondary | ICD-10-CM | POA: Diagnosis not present

## 2022-02-12 DIAGNOSIS — E785 Hyperlipidemia, unspecified: Secondary | ICD-10-CM | POA: Diagnosis not present

## 2022-02-12 DIAGNOSIS — J449 Chronic obstructive pulmonary disease, unspecified: Secondary | ICD-10-CM | POA: Diagnosis not present

## 2022-02-12 DIAGNOSIS — J9622 Acute and chronic respiratory failure with hypercapnia: Secondary | ICD-10-CM | POA: Diagnosis not present

## 2022-02-12 DIAGNOSIS — R791 Abnormal coagulation profile: Secondary | ICD-10-CM | POA: Diagnosis not present

## 2022-02-12 DIAGNOSIS — N189 Chronic kidney disease, unspecified: Secondary | ICD-10-CM | POA: Diagnosis not present

## 2022-02-12 DIAGNOSIS — D649 Anemia, unspecified: Secondary | ICD-10-CM | POA: Diagnosis not present

## 2022-02-12 DIAGNOSIS — D696 Thrombocytopenia, unspecified: Secondary | ICD-10-CM | POA: Diagnosis not present

## 2022-02-13 DIAGNOSIS — I493 Ventricular premature depolarization: Secondary | ICD-10-CM | POA: Diagnosis not present

## 2022-02-13 DIAGNOSIS — N184 Chronic kidney disease, stage 4 (severe): Secondary | ICD-10-CM | POA: Diagnosis not present

## 2022-02-13 DIAGNOSIS — R0989 Other specified symptoms and signs involving the circulatory and respiratory systems: Secondary | ICD-10-CM | POA: Diagnosis not present

## 2022-02-13 DIAGNOSIS — D649 Anemia, unspecified: Secondary | ICD-10-CM | POA: Diagnosis not present

## 2022-02-13 DIAGNOSIS — J449 Chronic obstructive pulmonary disease, unspecified: Secondary | ICD-10-CM | POA: Diagnosis not present

## 2022-02-13 DIAGNOSIS — I5020 Unspecified systolic (congestive) heart failure: Secondary | ICD-10-CM | POA: Diagnosis not present

## 2022-02-13 DIAGNOSIS — J9621 Acute and chronic respiratory failure with hypoxia: Secondary | ICD-10-CM | POA: Diagnosis not present

## 2022-02-13 DIAGNOSIS — Z952 Presence of prosthetic heart valve: Secondary | ICD-10-CM | POA: Diagnosis not present

## 2022-02-13 DIAGNOSIS — R0600 Dyspnea, unspecified: Secondary | ICD-10-CM | POA: Diagnosis not present

## 2022-02-13 DIAGNOSIS — D696 Thrombocytopenia, unspecified: Secondary | ICD-10-CM | POA: Diagnosis not present

## 2022-02-13 DIAGNOSIS — I428 Other cardiomyopathies: Secondary | ICD-10-CM | POA: Diagnosis not present

## 2022-02-13 DIAGNOSIS — R791 Abnormal coagulation profile: Secondary | ICD-10-CM | POA: Diagnosis not present

## 2022-02-13 DIAGNOSIS — Z7901 Long term (current) use of anticoagulants: Secondary | ICD-10-CM | POA: Diagnosis not present

## 2022-02-13 DIAGNOSIS — J9811 Atelectasis: Secondary | ICD-10-CM | POA: Diagnosis not present

## 2022-02-13 DIAGNOSIS — E119 Type 2 diabetes mellitus without complications: Secondary | ICD-10-CM | POA: Diagnosis not present

## 2022-02-13 DIAGNOSIS — J9622 Acute and chronic respiratory failure with hypercapnia: Secondary | ICD-10-CM | POA: Diagnosis not present

## 2022-02-13 DIAGNOSIS — N189 Chronic kidney disease, unspecified: Secondary | ICD-10-CM | POA: Diagnosis not present

## 2022-02-13 DIAGNOSIS — E785 Hyperlipidemia, unspecified: Secondary | ICD-10-CM | POA: Diagnosis not present

## 2022-12-14 ENCOUNTER — Telehealth: Payer: Self-pay

## 2022-12-14 NOTE — Patient Outreach (Signed)
  Care Coordination   Initial Visit Note   12/14/2022 Name: Jackson Hahn MRN: [id number] DOB: 1960-12-16  Jackson Hahn is a 62 y.o. year old male who sees No primary care provider on file. for primary care. I spoke with  Jackson Hahn by phone today.  What matters to the patients health and wellness today?  Placed call to patient today to review and offer [facility] care coordination program.  Patient reports that he got out of the hospital 2 days ago and he is short of breath. Reports that he is confused about his medications. Reports the bottle states one thing and discharge paper work states another.  Patient reports that he can not read the bottle.  Reports ankles are swollen. Reports that home health nurse is coming tomorrow.    Goals Addressed               This Visit's Progress     Shortness of breath and do not know how to take medications (pt-stated)        Interventions Today    Flowsheet Row Most Recent Value  Chronic Disease   Chronic disease during today's visit Chronic Obstructive Pulmonary Disease (COPD), Congestive Heart Failure (CHF)  General Interventions   General Interventions Discussed/Reviewed General Interventions Discussed, General Interventions Reviewed, Doctor Visits  Education Interventions   Education Provided Provided Education  [Reviewed with patient the importance of follow up with MD.  Reports that he tried.  Follow up scheduled for 12/29/22]  Nutrition Interventions   Nutrition Discussed/Reviewed Decreasing salt  Pharmacy Interventions   Pharmacy Dicussed/Reviewed Medications and their functions  [attempted to review medications but patient states that he can not read BOTTLE.]  Safety Interventions   Safety Discussed/Reviewed Home Safety  [Encouraged patient to call 911 for shortness of breath if he needs too.]      Placed call to MD office and spoke with Rolla. Reviewed concerns about patient with shortness of breath and not knowing how to take  his medications.  Reviewed home health nurse is coming tomorrow.  I was able to get his appointment to see PCP move to Monday April 15 at 930.  I placed call back to patient to inform.        SDOH assessments and interventions completed:  No     Care Coordination Interventions:  Yes, provided   Follow up plan: Follow up call scheduled for 12/20/2022    Encounter Outcome:  Pt. Visit Completed   Jackson Pavy, RN, BSN, CEN [facility] Coordinator [phone]

## 2022-12-20 ENCOUNTER — Ambulatory Visit: Payer: Self-pay

## 2022-12-20 NOTE — Patient Outreach (Signed)
  Care Coordination   Follow Up Visit Note   12/20/2022 Name: Jackson Hahn MRN: [id number] DOB: 21-Mar-1961  Jackson Hahn is a 62 y.o. year old male who sees O'Buch, [place], PA-C for primary care. I spoke with  Jackson Hahn by phone today.  What matters to the patients health and wellness today?  Placed call to patient after reviewing EMR.  Patient readmitted on 12/17/2022 for CHF and COPD. Patient reports to that he is still in the hospital. Reports he is hoping to go home today.  Patient reports that he was never told to reduce his salt intake. Reports that he can not read and his family told him that he could eat anything he wanted.  Patient reports that he does not Jackson Hahn and always gets take out.      Goals Addressed               This Visit's Progress     Shortness of breath and do not know how to take medications (pt-stated)         Interventions Today    Flowsheet Row Most Recent Value  Chronic Disease   Chronic disease during today's visit Chronic Obstructive Pulmonary Disease (COPD), Congestive Heart Failure (CHF)  General Interventions   General Interventions Discussed/Reviewed General Interventions Discussed  Education Interventions   Education Provided Provided Education  [Encouraged patient to talk to dietician while he is inpatient.  I reviewed low salt with patient via phone.]      Plan follow up call with patient after he discharges home.         SDOH assessments and interventions completed:  No     Care Coordination Interventions:  Yes, provided   Follow up plan: Follow up call scheduled for 12/25/2022    Encounter Outcome:  Pt. Visit Completed   Rowe Pavy, RN, BSN, CEN [facility] Coordinator [phone]

## 2022-12-25 ENCOUNTER — Ambulatory Visit: Payer: Self-pay

## 2022-12-25 NOTE — Patient Outreach (Signed)
  Care Coordination   Follow Up Visit Note   12/25/2022 Name: Jackson Hahn MRN: [id number] DOB: May 17, 1961  Jackson Hahn is a 62 y.o. year old male who sees O'Buch, [place], PA-C for primary care. I spoke with  Jackson Hahn by phone today.  What matters to the patients health and wellness today?  Patient discharged from [facility] on 12/21/2022.  Patient reports that he is breathing better. Reports that he is not salting anything.  Reports he had scrambled eggs and toast for breakfast.  Reports that he is not weighing.  States that he is taking his medications as prescribed.     Goals Addressed               This Visit's Progress     Shortness of breath and do not know how to take medications (pt-stated)        Interventions Today    Flowsheet Row Most Recent Value  Chronic Disease   Chronic disease during today's visit Chronic Obstructive Pulmonary Disease (COPD), Congestive Heart Failure (CHF)  General Interventions   General Interventions Discussed/Reviewed General Interventions Discussed, Doctor Visits  Doctor Visits Discussed/Reviewed Doctor Visits Discussed  [facility] appointment date for cardiology per care everywhere.]  Nutrition Interventions   Nutrition Discussed/Reviewed Decreasing salt  Pharmacy Interventions   Pharmacy Dicussed/Reviewed Pharmacy Topics Discussed  [Reviewed care everywhere and reviewed to the best of my ability medications on discharge list. Patient able to spell the names of medications but is not able to read.]      Extensive review of discharge information from care everywhere Reviewed with patient the importance of daily weights and when to call MD. Encouraged patient to start weighing tomorrow.  Encouraged patient to write weights down on paper. Reviewed with patient to call MD for weight gain of 3 pounds over night or 5 pounds in a week. Verbally reviewed low salt diet.  Patient reports that he understands better now.         SDOH assessments and interventions completed:  No     Care Coordination Interventions:  Yes, provided   Follow up plan: Follow up call scheduled for 01/01/2023    Encounter Outcome:  Pt. Visit Completed   Rowe Pavy, RN, BSN, CEN [facility] Coordinator [phone]

## 2023-01-01 ENCOUNTER — Ambulatory Visit: Payer: Self-pay
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
## 2023-01-08 ENCOUNTER — Ambulatory Visit: Payer: Self-pay
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
## 2023-02-10 IMAGING — DX DG ABD PORTABLE 1V
1 series · 1 of 1 positions shown · non-contrast
Comparison: None.

CLINICAL DATA: Peg placement.

EXAM:
PORTABLE ABDOMEN - 1 VIEW

[abdomen kub]
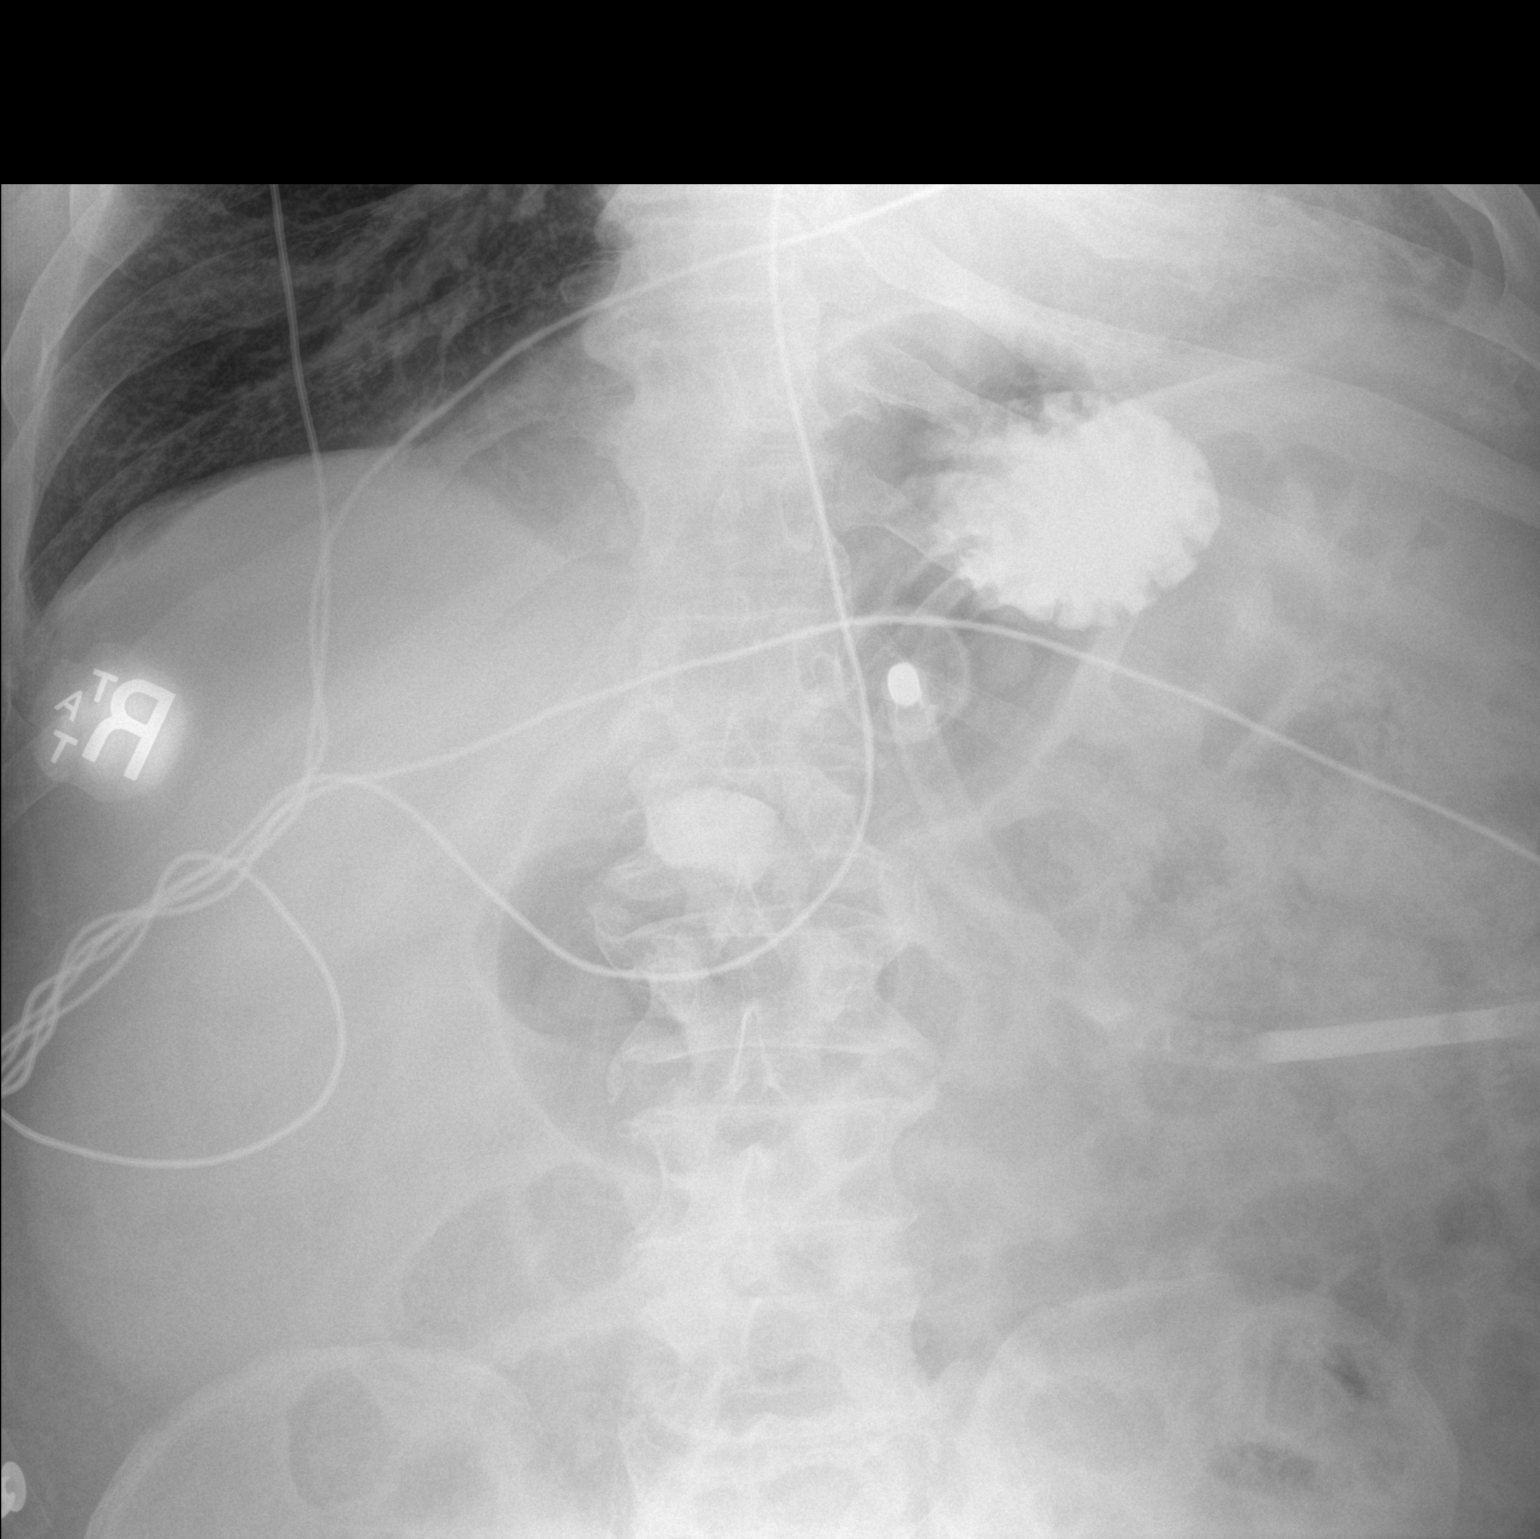

[1 of 1 positions shown; findings below may reference images not displayed]

FINDINGS: Single AP portable supine view of the abdomen obtained after the
installation of 20 cc Gastrografin. Gastrostomy tube projects over
the stomach. Contrast opacifies the stomach without evidence of
extravasation or leak. No bowel dilatation in the upper abdomen.
IMPRESSION: Gastrostomy tube in the stomach without evidence of extravasation or
leak.

## 2023-02-19 IMAGING — DX DG ABD PORTABLE 1V
2 series · 2 of 2 positions shown · non-contrast
Comparison: 01/28/2021

CLINICAL DATA: Fecal impaction

EXAM:
PORTABLE ABDOMEN - 1 VIEW

[abdomen kub (1 of 2)]
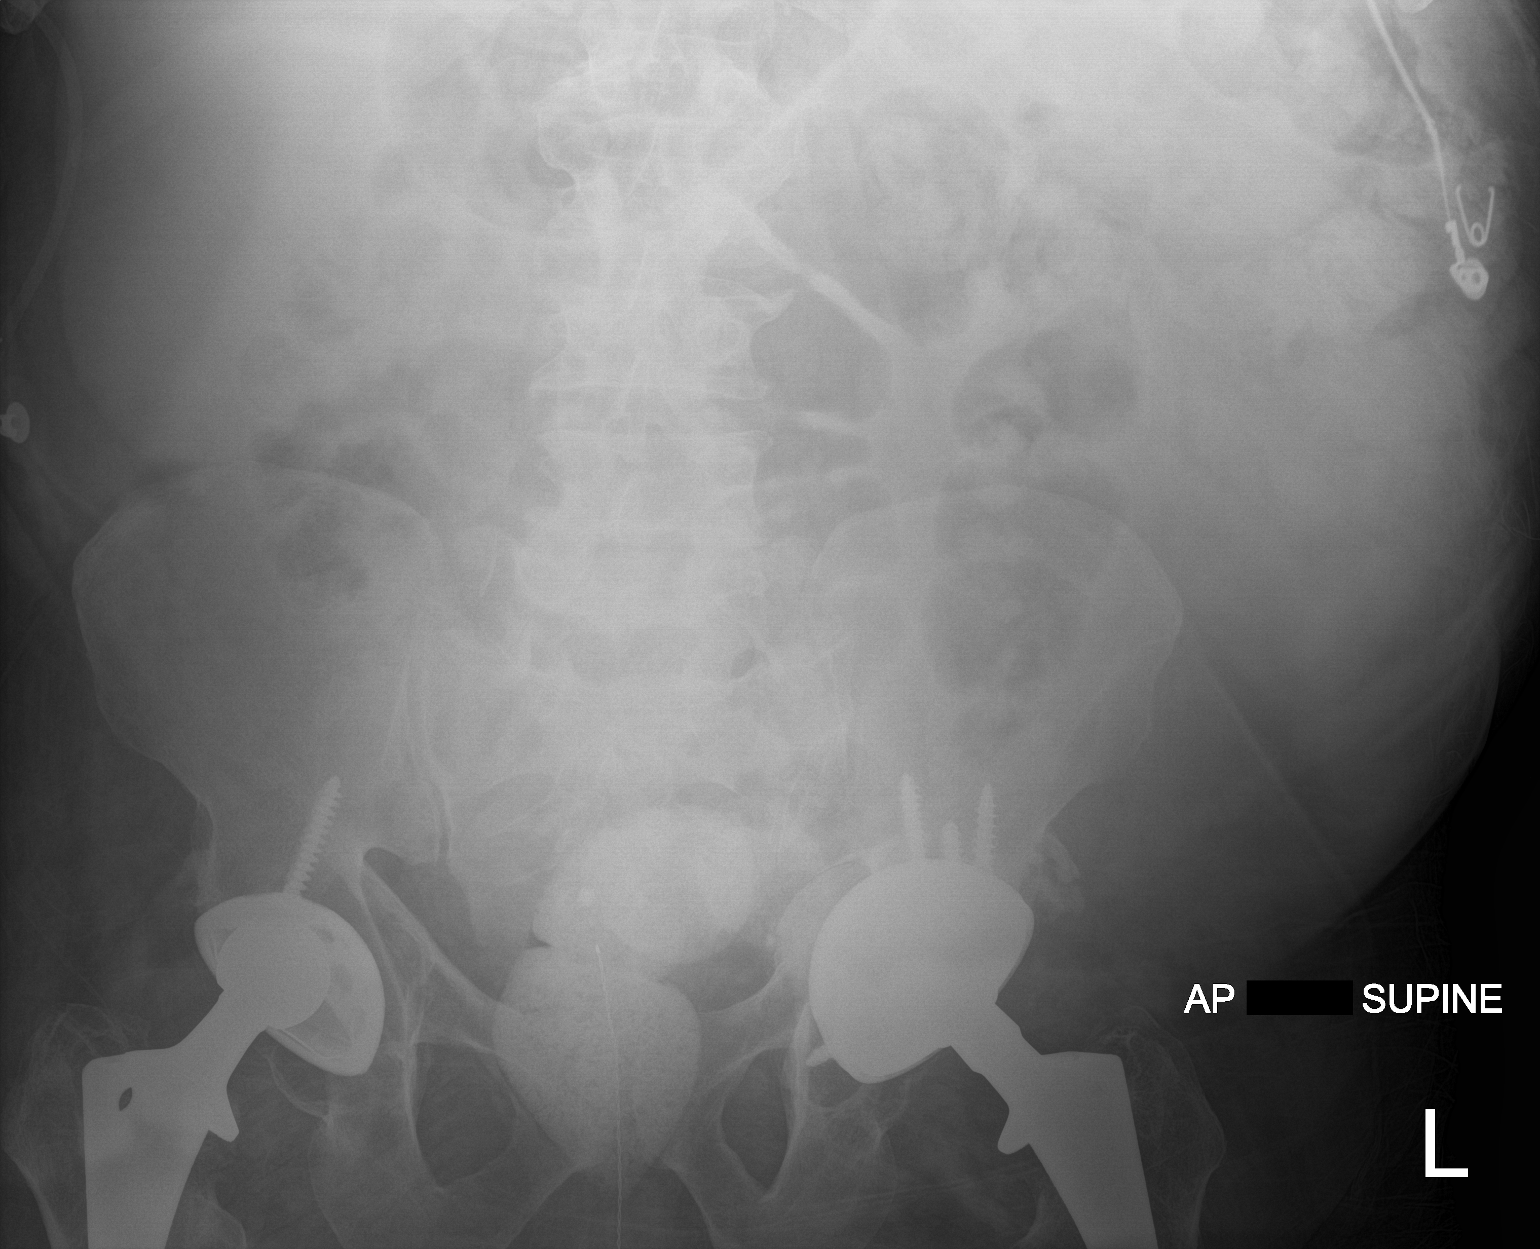

[abdomen kub (2 of 2)]
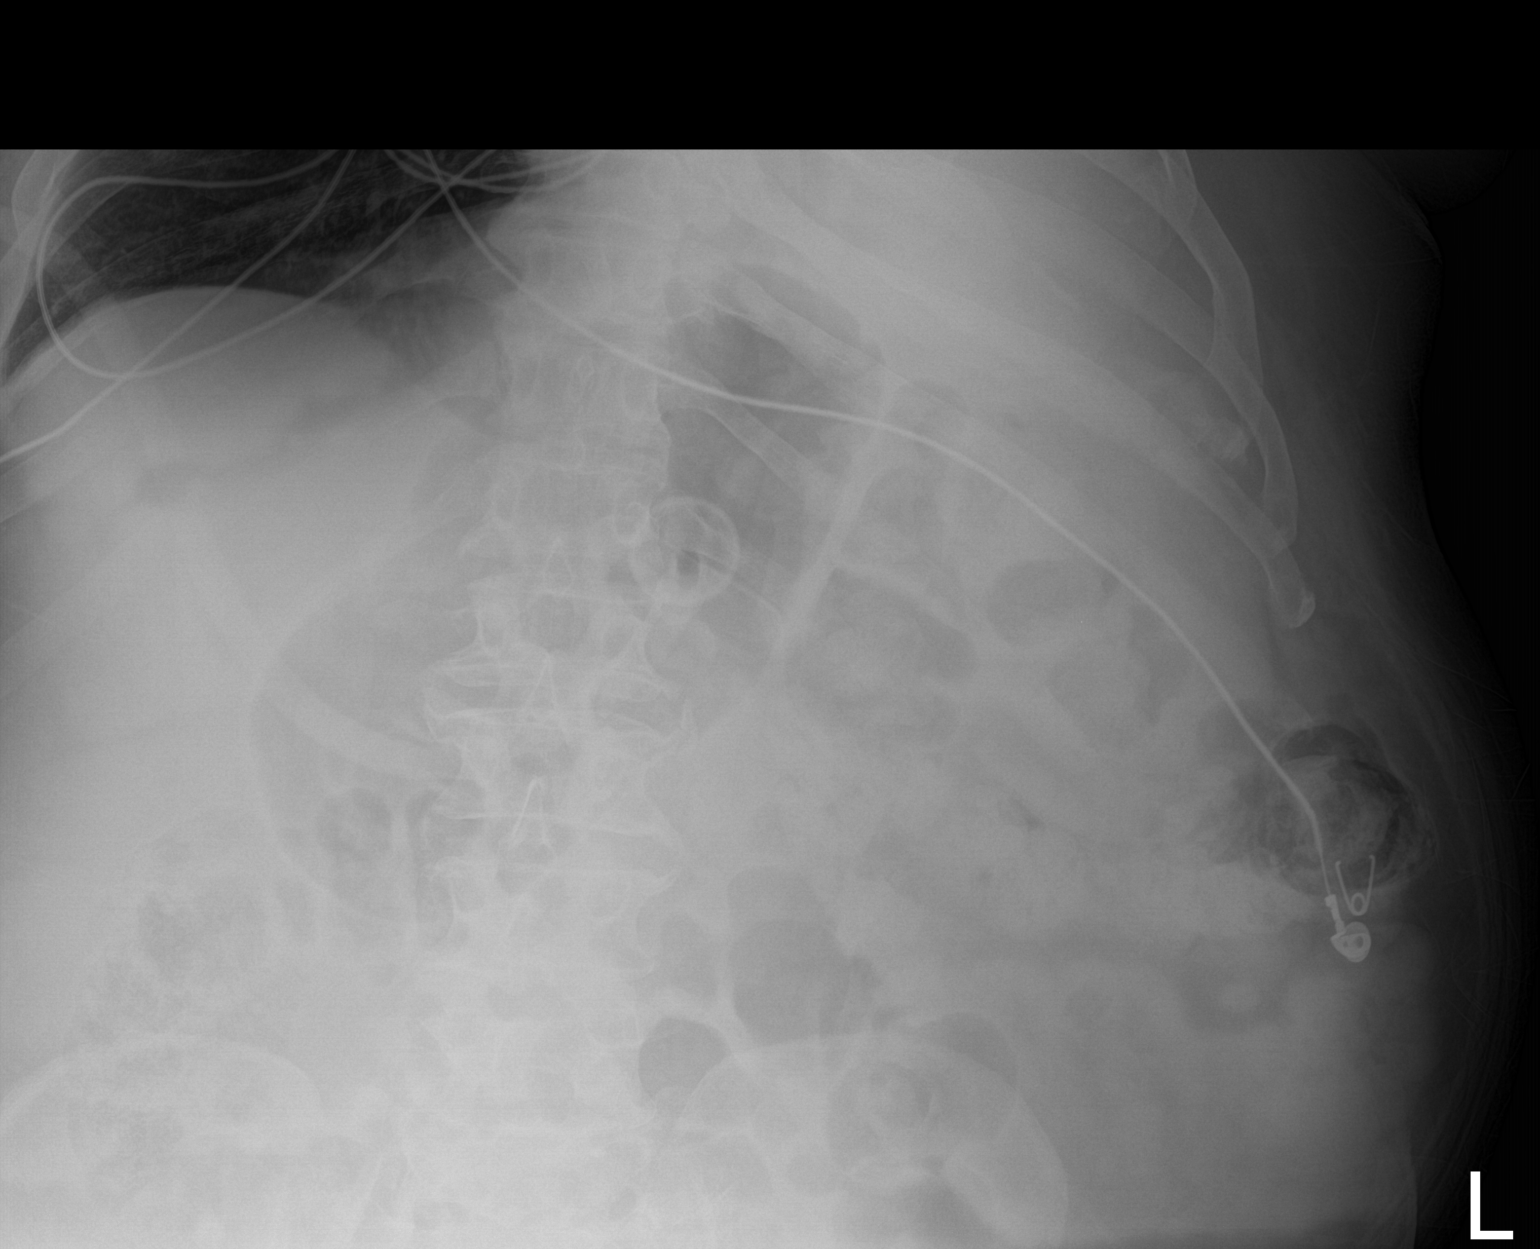

[2 of 2 positions shown; findings below may reference images not displayed]

FINDINGS: Nonobstructive pattern of bowel gas. Scattered stool throughout left
and right colon. Contrast material in the rectum. Percutaneous
gastrostomy tube. No obvious free air.
IMPRESSION: Nonobstructive pattern of bowel gas. Scattered stool throughout left
and right colon. Contrast material in the rectum.

## 2023-03-07 IMAGING — DX DG CHEST 1V PORT
1 series · 1 of 1 positions shown · non-contrast
Comparison: 01/28/2021.

CLINICAL DATA: Hypoxia.

EXAM:
PORTABLE CHEST 1 VIEW

[chest]
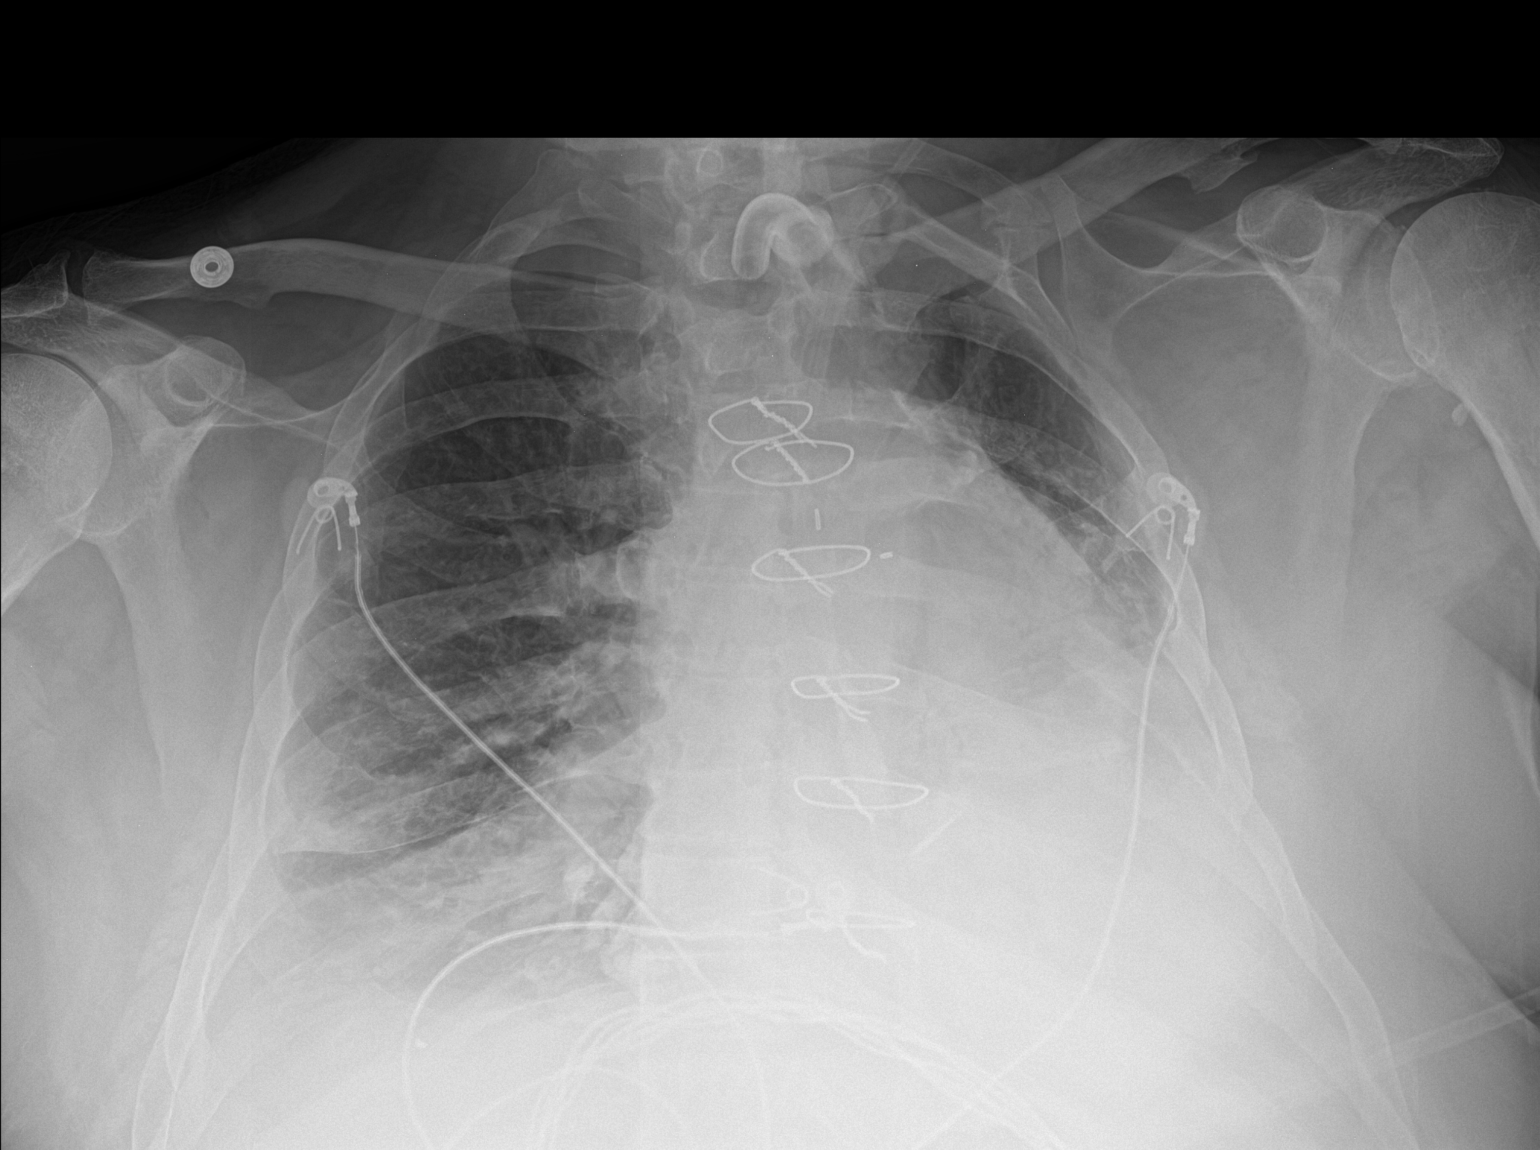

[1 of 1 positions shown; findings below may reference images not displayed]

FINDINGS: Tracheostomy tube in stable position. Prior CABG. Cardiomegaly. Low
lung volumes. Persistent left base and infiltrates/edema again
noted. Right base atelectasis and infiltrates/edema noted on today's
exam. Small bilateral pleural effusions cannot be excluded. No
pneumothorax. Left cervical rib noted. Degenerative change thoracic
spine.
IMPRESSION: 1.  Tracheostomy tube in stable position.

2.  Prior CABG.  Scratch cardiomegaly.

3. Persistent left base atelectasis and infiltrates/edema again
noted. Right base atelectasis and infiltrates/edema noted on today's
exam. Small bilateral pleural effusions cannot be excluded. CHF
could present in this fashion.

## 2023-03-09 IMAGING — DX DG CHEST 1V PORT
1 series · 1 of 1 positions shown · non-contrast
Comparison: Chest x-ray 02/22/2021, CT chest 01/11/2021

CLINICAL DATA: Respiratory failure.

EXAM:
PORTABLE CHEST 1 VIEW

[chest ap]
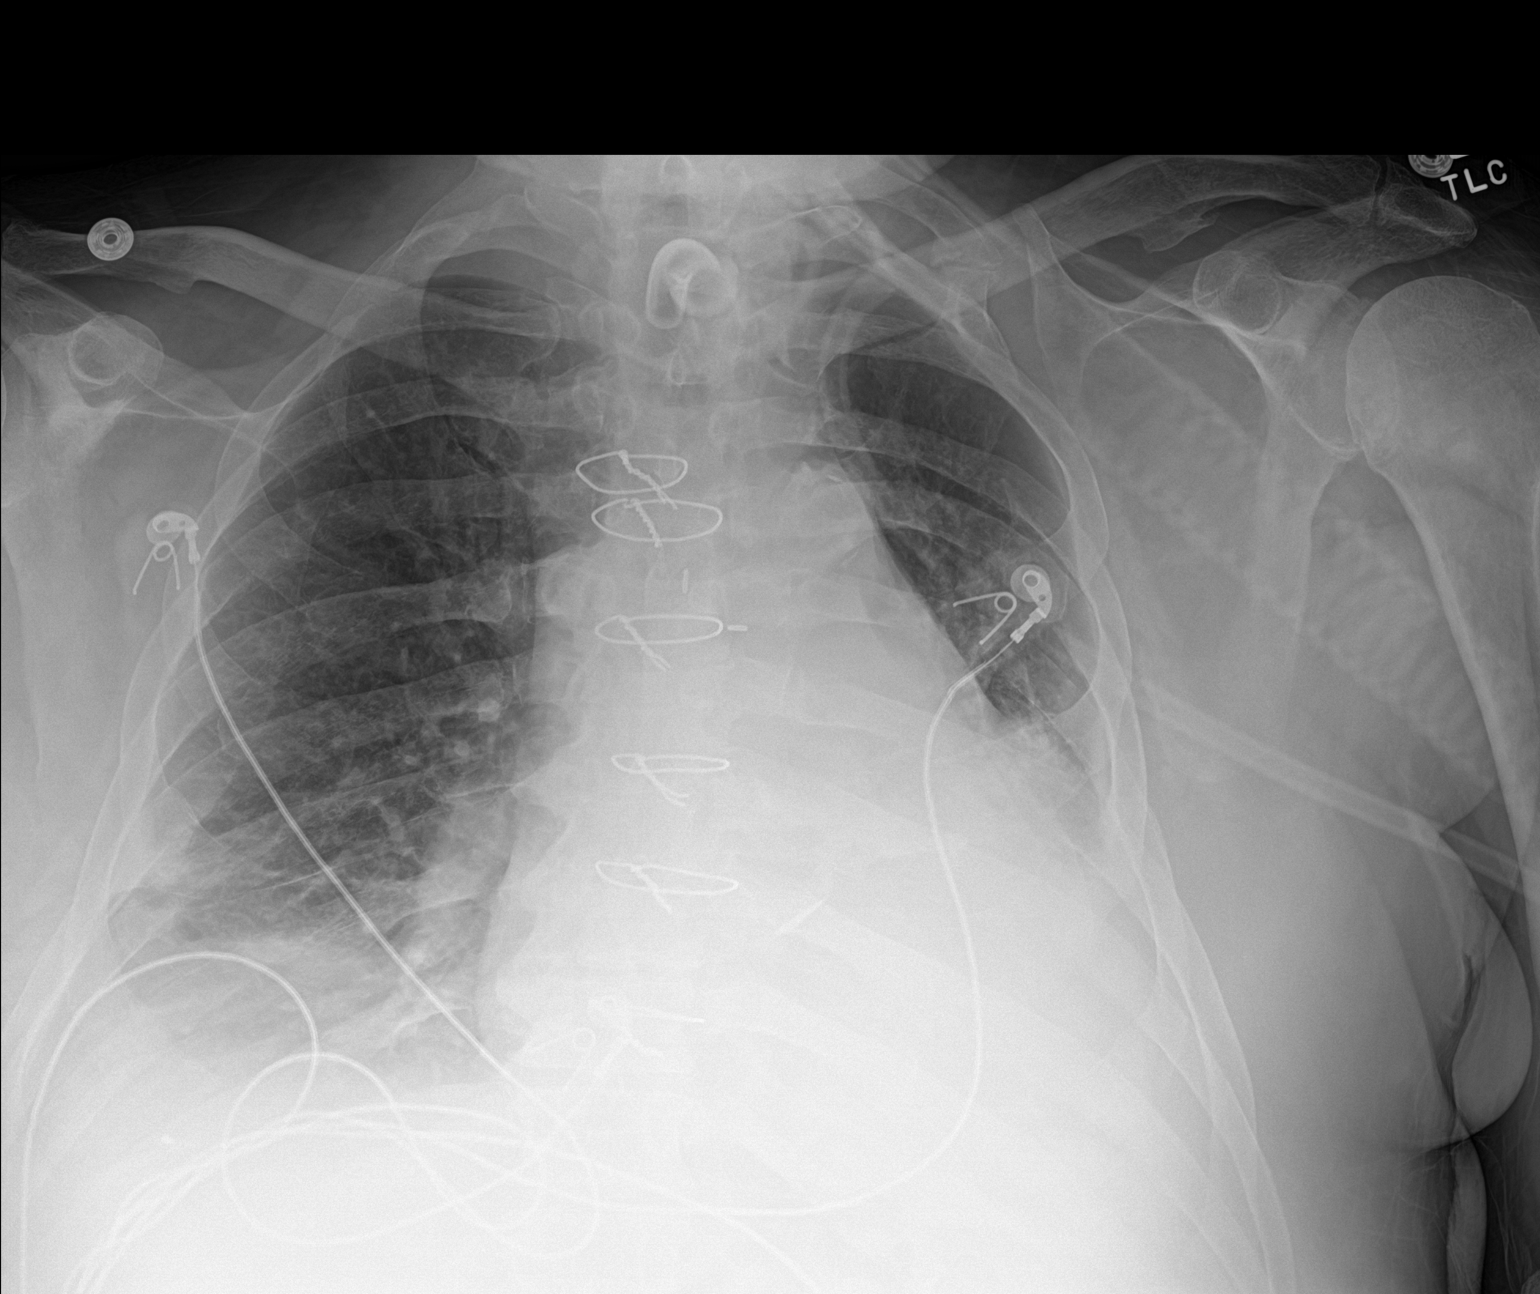

[1 of 1 positions shown; findings below may reference images not displayed]

FINDINGS: Tracheostomy in similar position.

The heart size and mediastinal contours are unchanged. Cardiac
surgical changes overlie the mediastinum. Aortic calcification.
Persistent right to left mediastinal shift.

Similar-appearing right lower lung zone airspace opacity and likely
trace to small pleural effusion. Similar-appearing left lower lung
zone airspace opacity with associated atelectasis. Superimposed left
pleural effusion not excluded.

No acute osseous abnormality.
IMPRESSION: Unchanged findings of right lower lung zone airspace opacity and
likely trace to small volume pleural effusion. Unchanged left lower
lung zone airspace opacity and associated atelectasis with
superimposed left pleural effusion not excluded. Persistent right to
left mediastinal shift due to left lung volume loss.
